# Patient Record
Sex: Male | Born: 1975 | Race: Black or African American | Hispanic: No | Marital: Single | Smoking: Current some day smoker
Health system: Southern US, Community
[De-identification: ages and names within clinical notes are randomized; demographics above are authoritative.]

## PROBLEM LIST (undated history)

## (undated) DIAGNOSIS — G5603 Carpal tunnel syndrome, bilateral upper limbs: Secondary | ICD-10-CM

## (undated) DIAGNOSIS — E119 Type 2 diabetes mellitus without complications: Secondary | ICD-10-CM

## (undated) HISTORY — PX: HERNIA REPAIR: SHX51

## (undated) HISTORY — DX: Type 2 diabetes mellitus without complications: E11.9

## (undated) HISTORY — PX: SPINAL CORD STIMULATOR TRIAL: SHX5380

## (undated) HISTORY — DX: Carpal tunnel syndrome, bilateral upper limbs: G56.03

## (undated) HISTORY — PX: EYE SURGERY: SHX253

---

## 2002-03-19 ENCOUNTER — Emergency Department (HOSPITAL_COMMUNITY): Admission: EM | Admit: 2002-03-19 | Discharge: 2002-03-19 | Payer: Self-pay | Admitting: Emergency Medicine

## 2004-05-15 ENCOUNTER — Emergency Department (HOSPITAL_COMMUNITY): Admission: EM | Admit: 2004-05-15 | Discharge: 2004-05-15 | Payer: Self-pay | Admitting: *Deleted

## 2006-04-06 ENCOUNTER — Emergency Department (HOSPITAL_COMMUNITY): Admission: EM | Admit: 2006-04-06 | Discharge: 2006-04-06 | Payer: Self-pay | Admitting: Emergency Medicine

## 2008-03-30 ENCOUNTER — Emergency Department (HOSPITAL_COMMUNITY): Admission: EM | Admit: 2008-03-30 | Discharge: 2008-03-30 | Payer: Self-pay | Admitting: Emergency Medicine

## 2008-05-11 ENCOUNTER — Emergency Department (HOSPITAL_COMMUNITY): Admission: EM | Admit: 2008-05-11 | Discharge: 2008-05-11 | Payer: Self-pay | Admitting: Emergency Medicine

## 2011-06-04 LAB — DIFFERENTIAL
Basophils Relative: 1
Eosinophils Absolute: 0.1
Eosinophils Relative: 2
Monocytes Absolute: 0.4

## 2011-06-04 LAB — CBC
HCT: 43.8
MCHC: 34
Platelets: 250
RBC: 4.61
WBC: 5.4

## 2011-06-04 LAB — ANA: Anti Nuclear Antibody(ANA): NEGATIVE

## 2011-06-04 LAB — POCT I-STAT, CHEM 8
Calcium, Ion: 1.11 — ABNORMAL LOW
Chloride: 105
HCT: 45
Hemoglobin: 15.3
Sodium: 140
TCO2: 27

## 2011-06-04 LAB — RHEUMATOID FACTOR: Rhuematoid fact SerPl-aCnc: <20

## 2014-03-25 ENCOUNTER — Emergency Department (HOSPITAL_BASED_OUTPATIENT_CLINIC_OR_DEPARTMENT_OTHER)
Admission: EM | Admit: 2014-03-25 | Discharge: 2014-03-26 | Disposition: A | Discharge status: Home / Self Care | Payer: Worker's Compensation | Attending: Emergency Medicine | Admitting: Emergency Medicine

## 2014-03-25 ENCOUNTER — Encounter (HOSPITAL_BASED_OUTPATIENT_CLINIC_OR_DEPARTMENT_OTHER): Payer: Self-pay | Admitting: Emergency Medicine

## 2014-03-25 ENCOUNTER — Emergency Department (HOSPITAL_BASED_OUTPATIENT_CLINIC_OR_DEPARTMENT_OTHER): Payer: Worker's Compensation

## 2014-03-25 DIAGNOSIS — S0280XA Fracture of other specified skull and facial bones, unspecified side, initial encounter for closed fracture: Secondary | ICD-10-CM | POA: Insufficient documentation

## 2014-03-25 DIAGNOSIS — S0993XA Unspecified injury of face, initial encounter: Secondary | ICD-10-CM | POA: Insufficient documentation

## 2014-03-25 DIAGNOSIS — S065X0A Traumatic subdural hemorrhage without loss of consciousness, initial encounter: Secondary | ICD-10-CM | POA: Insufficient documentation

## 2014-03-25 DIAGNOSIS — H1133 Conjunctival hemorrhage, bilateral: Secondary | ICD-10-CM

## 2014-03-25 DIAGNOSIS — S0291XA Unspecified fracture of skull, initial encounter for closed fracture: Secondary | ICD-10-CM

## 2014-03-25 DIAGNOSIS — H1130 Conjunctival hemorrhage, unspecified eye: Secondary | ICD-10-CM | POA: Diagnosis not present

## 2014-03-25 DIAGNOSIS — S199XXA Unspecified injury of neck, initial encounter: Secondary | ICD-10-CM
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
## 2014-03-26 MED ORDER — HYDROCODONE-ACETAMINOPHEN 5-325 MG PO TABS: 1.0000 | ORAL_TABLET | Freq: Four times a day (QID) | ORAL | Status: DC | PRN

## 2014-03-26 MED ORDER — HYDROCODONE-ACETAMINOPHEN 5-325 MG PO TABS
1.0000 | ORAL_TABLET | Freq: Once | ORAL | Status: AC
  Administered 2014-03-26: 1 via ORAL
  Filled 2014-03-26: qty 1

## 2014-03-26 MED ORDER — DIPHENHYDRAMINE HCL 50 MG/ML IJ SOLN: 50.0000 mg | Freq: Once | INTRAMUSCULAR | Status: DC

## 2014-03-26 MED ORDER — METHYLPREDNISOLONE SODIUM SUCC 125 MG IJ SOLR: 125.0000 mg | Freq: Once | INTRAMUSCULAR | Status: DC

## 2014-03-26 MED ORDER — FAMOTIDINE IN NACL 20-0.9 MG/50ML-% IV SOLN: 20.0000 mg | Freq: Once | INTRAVENOUS | Status: DC

## 2019-06-22 ENCOUNTER — Other Ambulatory Visit: Payer: Self-pay

## 2019-06-22 DIAGNOSIS — Z20822 Contact with and (suspected) exposure to covid-19: Secondary | ICD-10-CM

## 2019-06-24 LAB — NOVEL CORONAVIRUS, NAA: SARS-CoV-2, NAA: NOT DETECTED

## 2019-07-08 ENCOUNTER — Other Ambulatory Visit: Payer: Self-pay

## 2019-07-08 DIAGNOSIS — Z20822 Contact with and (suspected) exposure to covid-19: Secondary | ICD-10-CM

## 2019-07-09 LAB — NOVEL CORONAVIRUS, NAA

## 2019-08-02 ENCOUNTER — Other Ambulatory Visit: Payer: Self-pay

## 2019-08-02 DIAGNOSIS — Z20822 Contact with and (suspected) exposure to covid-19: Secondary | ICD-10-CM

## 2019-08-03 LAB — NOVEL CORONAVIRUS, NAA: SARS-CoV-2, NAA: NOT DETECTED

## 2020-12-20 ENCOUNTER — Other Ambulatory Visit: Payer: Self-pay | Admitting: Family Medicine

## 2020-12-20 ENCOUNTER — Ambulatory Visit: Payer: Self-pay

## 2020-12-20 ENCOUNTER — Other Ambulatory Visit: Payer: Self-pay

## 2020-12-20 DIAGNOSIS — R102 Pelvic and perineal pain: Secondary | ICD-10-CM

## 2021-03-21 ENCOUNTER — Other Ambulatory Visit: Payer: Self-pay | Admitting: Orthopedic Surgery

## 2021-03-21 DIAGNOSIS — G8929 Other chronic pain: Secondary | ICD-10-CM

## 2021-04-09 ENCOUNTER — Other Ambulatory Visit: Payer: Self-pay

## 2021-04-09 ENCOUNTER — Ambulatory Visit
Admission: RE | Admit: 2021-04-09 | Discharge: 2021-04-09 | Disposition: A | Discharge status: Home / Self Care | Payer: No Typology Code available for payment source | Source: Ambulatory Visit | Attending: Orthopedic Surgery | Admitting: Orthopedic Surgery

## 2021-04-09 DIAGNOSIS — M545 Low back pain, unspecified: Secondary | ICD-10-CM

## 2021-04-09 DIAGNOSIS — G8929 Other chronic pain: Secondary | ICD-10-CM

## 2021-04-09 MED ORDER — METHYLPREDNISOLONE ACETATE 40 MG/ML INJ SUSP (RADIOLOG
80.0000 mg | Freq: Once | INTRAMUSCULAR | Status: AC
  Administered 2021-04-09: 80 mg via INTRA_ARTICULAR

## 2021-04-09 MED ORDER — METHYLPREDNISOLONE ACETATE 80 MG/ML IJ SUSP: 80.0000 mg | Freq: Once | INTRAMUSCULAR | Status: AC

## 2021-12-15 ENCOUNTER — Encounter (HOSPITAL_COMMUNITY): Payer: Self-pay | Admitting: Emergency Medicine

## 2021-12-15 ENCOUNTER — Observation Stay (HOSPITAL_COMMUNITY)
Admission: EM | Admit: 2021-12-15 | Discharge: 2021-12-17 | Disposition: A | Discharge status: Home / Self Care | Payer: Self-pay | Attending: Internal Medicine | Admitting: Internal Medicine

## 2021-12-15 ENCOUNTER — Other Ambulatory Visit: Payer: Self-pay

## 2021-12-15 DIAGNOSIS — E871 Hypo-osmolality and hyponatremia: Secondary | ICD-10-CM | POA: Insufficient documentation

## 2021-12-15 DIAGNOSIS — R739 Hyperglycemia, unspecified: Secondary | ICD-10-CM

## 2021-12-15 DIAGNOSIS — E1165 Type 2 diabetes mellitus with hyperglycemia: Principal | ICD-10-CM | POA: Insufficient documentation

## 2021-12-15 DIAGNOSIS — Z79899 Other long term (current) drug therapy: Secondary | ICD-10-CM | POA: Insufficient documentation

## 2021-12-15 DIAGNOSIS — E869 Volume depletion, unspecified: Secondary | ICD-10-CM | POA: Insufficient documentation

## 2021-12-15 DIAGNOSIS — R718 Other abnormality of red blood cells: Secondary | ICD-10-CM

## 2021-12-15 DIAGNOSIS — R748 Abnormal levels of other serum enzymes: Secondary | ICD-10-CM | POA: Insufficient documentation

## 2021-12-15 DIAGNOSIS — E1100 Type 2 diabetes mellitus with hyperosmolarity without nonketotic hyperglycemic-hyperosmolar coma (NKHHC): Secondary | ICD-10-CM | POA: Diagnosis present

## 2021-12-15 DIAGNOSIS — R7989 Other specified abnormal findings of blood chemistry: Secondary | ICD-10-CM

## 2021-12-15 DIAGNOSIS — E1110 Type 2 diabetes mellitus with ketoacidosis without coma: Secondary | ICD-10-CM | POA: Diagnosis present

## 2021-12-15 DIAGNOSIS — N179 Acute kidney failure, unspecified: Secondary | ICD-10-CM | POA: Insufficient documentation

## 2021-12-15 DIAGNOSIS — D582 Other hemoglobinopathies: Secondary | ICD-10-CM

## 2021-12-15 DIAGNOSIS — E860 Dehydration: Secondary | ICD-10-CM | POA: Insufficient documentation

## 2021-12-15 LAB — COMPREHENSIVE METABOLIC PANEL
ALT: 29 U/L (ref 0–44)
AST: 21 U/L (ref 15–41)
Albumin: 4.1 g/dL (ref 3.5–5.0)
Alkaline Phosphatase: 106 U/L (ref 38–126)
Anion gap: 18 — ABNORMAL HIGH (ref 5–15)
BUN: 11 mg/dL (ref 6–20)
CO2: 25 mmol/L (ref 22–32)
Calcium: 9.8 mg/dL (ref 8.9–10.3)
Chloride: 89 mmol/L — ABNORMAL LOW (ref 98–111)
Creatinine, Ser: 1.45 mg/dL — ABNORMAL HIGH (ref 0.61–1.24)
GFR, Estimated: 60 mL/min — ABNORMAL LOW (ref >60)
Glucose, Bld: 522 mg/dL (ref 70–99)
Potassium: 3.9 mmol/L (ref 3.5–5.1)
Sodium: 132 mmol/L — ABNORMAL LOW (ref 135–145)
Total Bilirubin: 1.3 mg/dL — ABNORMAL HIGH (ref 0.3–1.2)
Total Protein: 7.9 g/dL (ref 6.5–8.1)

## 2021-12-15 LAB — CBC
HCT: 53.6 % — ABNORMAL HIGH (ref 39.0–52.0)
Hemoglobin: 18.8 g/dL — ABNORMAL HIGH (ref 13.0–17.0)
MCH: 31.3 pg (ref 26.0–34.0)
MCHC: 35.1 g/dL (ref 30.0–36.0)
MCV: 89.2 fL (ref 80.0–100.0)
Platelets: 257 10*3/uL (ref 150–400)
RBC: 6.01 MIL/uL — ABNORMAL HIGH (ref 4.22–5.81)
RDW: 12.2 % (ref 11.5–15.5)
WBC: 7.7 10*3/uL (ref 4.0–10.5)
nRBC: 0 % (ref 0.0–0.2)

## 2021-12-15 LAB — LIPASE, BLOOD: Lipase: 62 U/L — ABNORMAL HIGH (ref 11–51)

## 2021-12-15 NOTE — ED Triage Notes (Addendum)
Pt presents to ED triage with several complaints including dry mouth, smell aversions,inability to tolerate food other protein shakes and fruit, inability to swallow food due to constant need to chew- relating back to dry mouth, excessive water consumption, frequent urination and constipation. Pt states that everything smells like car exhaust. Was able to relieve constipation after eating kiwi fruit. Pt state "I just want to eat".  Reports symptoms have been ongoing for two weeks, however he has lost 15-20lbs in past three weeks.  ?

## 2021-12-16 DIAGNOSIS — N179 Acute kidney failure, unspecified: Secondary | ICD-10-CM

## 2021-12-16 DIAGNOSIS — D582 Other hemoglobinopathies: Secondary | ICD-10-CM

## 2021-12-16 DIAGNOSIS — E1100 Type 2 diabetes mellitus with hyperosmolarity without nonketotic hyperglycemic-hyperosmolar coma (NKHHC): Secondary | ICD-10-CM | POA: Diagnosis present

## 2021-12-16 DIAGNOSIS — Z794 Long term (current) use of insulin: Secondary | ICD-10-CM

## 2021-12-16 DIAGNOSIS — E1165 Type 2 diabetes mellitus with hyperglycemia: Secondary | ICD-10-CM

## 2021-12-16 DIAGNOSIS — R7989 Other specified abnormal findings of blood chemistry: Secondary | ICD-10-CM

## 2021-12-16 DIAGNOSIS — R748 Abnormal levels of other serum enzymes: Secondary | ICD-10-CM

## 2021-12-16 DIAGNOSIS — R718 Other abnormality of red blood cells: Secondary | ICD-10-CM

## 2021-12-16 DIAGNOSIS — E1110 Type 2 diabetes mellitus with ketoacidosis without coma: Secondary | ICD-10-CM | POA: Diagnosis present

## 2021-12-16 DIAGNOSIS — E860 Dehydration: Secondary | ICD-10-CM

## 2021-12-16 LAB — BASIC METABOLIC PANEL
Anion gap: 16 — ABNORMAL HIGH (ref 5–15)
Anion gap: 9 (ref 5–15)
Anion gap: 8 (ref 5–15)
Anion gap: 9 (ref 5–15)
BUN: 11 mg/dL (ref 6–20)
BUN: 9 mg/dL (ref 6–20)
BUN: 8 mg/dL (ref 6–20)
BUN: 8 mg/dL (ref 6–20)
CO2: 26 mmol/L (ref 22–32)
CO2: 25 mmol/L (ref 22–32)
CO2: 27 mmol/L (ref 22–32)
CO2: 26 mmol/L (ref 22–32)
Calcium: 9.9 mg/dL (ref 8.9–10.3)
Calcium: 8.9 mg/dL (ref 8.9–10.3)
Calcium: 8.8 mg/dL — ABNORMAL LOW (ref 8.9–10.3)
Calcium: 8.5 mg/dL — ABNORMAL LOW (ref 8.9–10.3)
Chloride: 92 mmol/L — ABNORMAL LOW (ref 98–111)
Chloride: 103 mmol/L (ref 98–111)
Chloride: 103 mmol/L (ref 98–111)
Chloride: 105 mmol/L (ref 98–111)
Creatinine, Ser: 1.39 mg/dL — ABNORMAL HIGH (ref 0.61–1.24)
Creatinine, Ser: 0.91 mg/dL (ref 0.61–1.24)
Creatinine, Ser: 0.87 mg/dL (ref 0.61–1.24)
Creatinine, Ser: 0.96 mg/dL (ref 0.61–1.24)
GFR, Estimated: >60 mL/min (ref >60)
GFR, Estimated: >60 mL/min (ref >60)
GFR, Estimated: >60 mL/min (ref >60)
GFR, Estimated: >60 mL/min (ref >60)
Glucose, Bld: 562 mg/dL (ref 70–99)
Glucose, Bld: 285 mg/dL — ABNORMAL HIGH (ref 70–99)
Glucose, Bld: 321 mg/dL — ABNORMAL HIGH (ref 70–99)
Glucose, Bld: 224 mg/dL — ABNORMAL HIGH (ref 70–99)
Potassium: 4.2 mmol/L (ref 3.5–5.1)
Potassium: 3.4 mmol/L — ABNORMAL LOW (ref 3.5–5.1)
Potassium: 3.1 mmol/L — ABNORMAL LOW (ref 3.5–5.1)
Potassium: 3.5 mmol/L (ref 3.5–5.1)
Sodium: 134 mmol/L — ABNORMAL LOW (ref 135–145)
Sodium: 137 mmol/L (ref 135–145)
Sodium: 138 mmol/L (ref 135–145)
Sodium: 140 mmol/L (ref 135–145)

## 2021-12-16 LAB — CBG MONITORING, ED
Glucose-Capillary: 122 mg/dL — ABNORMAL HIGH (ref 70–99)
Glucose-Capillary: 146 mg/dL — ABNORMAL HIGH (ref 70–99)
Glucose-Capillary: 209 mg/dL — ABNORMAL HIGH (ref 70–99)
Glucose-Capillary: 215 mg/dL — ABNORMAL HIGH (ref 70–99)
Glucose-Capillary: 227 mg/dL — ABNORMAL HIGH (ref 70–99)
Glucose-Capillary: 258 mg/dL — ABNORMAL HIGH (ref 70–99)
Glucose-Capillary: 268 mg/dL — ABNORMAL HIGH (ref 70–99)
Glucose-Capillary: 277 mg/dL — ABNORMAL HIGH (ref 70–99)
Glucose-Capillary: 284 mg/dL — ABNORMAL HIGH (ref 70–99)
Glucose-Capillary: 397 mg/dL — ABNORMAL HIGH (ref 70–99)
Glucose-Capillary: 533 mg/dL (ref 70–99)
Glucose-Capillary: >600 mg/dL (ref 70–99)

## 2021-12-16 LAB — OSMOLALITY: Osmolality: 323 mOsm/kg (ref 275–295)

## 2021-12-16 LAB — I-STAT VENOUS BLOOD GAS, ED
Acid-Base Excess: 2 mmol/L (ref 0.0–2.0)
Bicarbonate: 28.4 mmol/L — ABNORMAL HIGH (ref 20.0–28.0)
Calcium, Ion: 1.14 mmol/L — ABNORMAL LOW (ref 1.15–1.40)
HCT: 53 % — ABNORMAL HIGH (ref 39.0–52.0)
Hemoglobin: 18 g/dL — ABNORMAL HIGH (ref 13.0–17.0)
O2 Saturation: 39 %
Potassium: 4.2 mmol/L (ref 3.5–5.1)
Sodium: 131 mmol/L — ABNORMAL LOW (ref 135–145)
TCO2: 30 mmol/L (ref 22–32)
pCO2, Ven: 50.2 mmHg (ref 44–60)
pH, Ven: 7.361 (ref 7.25–7.43)
pO2, Ven: 24 mmHg — CL (ref 32–45)

## 2021-12-16 LAB — URINALYSIS, ROUTINE W REFLEX MICROSCOPIC
Bacteria, UA: NONE SEEN
Bilirubin Urine: NEGATIVE
Glucose, UA: >=500 mg/dL — AB
Hgb urine dipstick: NEGATIVE
Ketones, ur: 80 mg/dL — AB
Leukocytes,Ua: NEGATIVE
Nitrite: NEGATIVE
Protein, ur: NEGATIVE mg/dL
Specific Gravity, Urine: 1.031 — ABNORMAL HIGH (ref 1.005–1.030)
pH: 5 (ref 5.0–8.0)

## 2021-12-16 LAB — BETA-HYDROXYBUTYRIC ACID: Beta-Hydroxybutyric Acid: 5.08 mmol/L — ABNORMAL HIGH (ref 0.05–0.27)

## 2021-12-16 LAB — GLUCOSE, CAPILLARY
Glucose-Capillary: 413 mg/dL — ABNORMAL HIGH (ref 70–99)
Glucose-Capillary: 420 mg/dL — ABNORMAL HIGH (ref 70–99)

## 2021-12-16 LAB — PHOSPHORUS: Phosphorus: 3.1 mg/dL (ref 2.5–4.6)

## 2021-12-16 LAB — MAGNESIUM: Magnesium: 2 mg/dL (ref 1.7–2.4)

## 2021-12-16 MED ORDER — DEXTROSE 50 % IV SOLN: 0.0000 mL | INTRAVENOUS | Status: DC | PRN

## 2021-12-16 MED ORDER — LACTATED RINGERS IV SOLN: INTRAVENOUS | Status: DC

## 2021-12-16 MED ORDER — POTASSIUM CHLORIDE CRYS ER 20 MEQ PO TBCR
40.0000 meq | EXTENDED_RELEASE_TABLET | Freq: Once | ORAL | Status: AC
  Administered 2021-12-16: 40 meq via ORAL
  Filled 2021-12-16: qty 2

## 2021-12-16 MED ORDER — INSULIN REGULAR(HUMAN) IN NACL 100-0.9 UT/100ML-% IV SOLN
INTRAVENOUS | Status: DC
  Administered 2021-12-17: 7.5 [IU]/h via INTRAVENOUS
  Filled 2021-12-16: qty 100

## 2021-12-16 MED ORDER — SODIUM CHLORIDE 0.9 % IV BOLUS
1000.0000 mL | Freq: Once | INTRAVENOUS | Status: AC
  Administered 2021-12-16: 1000 mL via INTRAVENOUS

## 2021-12-16 MED ORDER — ONDANSETRON HCL 4 MG/2ML IJ SOLN: 4.0000 mg | Freq: Four times a day (QID) | INTRAMUSCULAR | Status: DC | PRN

## 2021-12-16 MED ORDER — POTASSIUM CHLORIDE 10 MEQ/100ML IV SOLN
10.0000 meq | INTRAVENOUS | Status: AC
  Administered 2021-12-16 (×2): 10 meq via INTRAVENOUS
  Filled 2021-12-16 (×2): qty 100

## 2021-12-16 MED ORDER — DEXTROSE IN LACTATED RINGERS 5 % IV SOLN: INTRAVENOUS | Status: DC

## 2021-12-16 MED ORDER — INSULIN GLARGINE-YFGN 100 UNIT/ML ~~LOC~~ SOLN
10.0000 [IU] | Freq: Every day | SUBCUTANEOUS | Status: DC
  Administered 2021-12-16: 10 [IU] via SUBCUTANEOUS
  Filled 2021-12-16 (×2): qty 0.1

## 2021-12-16 MED ORDER — PNEUMOCOCCAL 20-VAL CONJ VACC 0.5 ML IM SUSY
0.5000 mL | PREFILLED_SYRINGE | INTRAMUSCULAR | Status: DC
  Filled 2021-12-16: qty 0.5

## 2021-12-16 MED ORDER — POTASSIUM CHLORIDE 10 MEQ/100ML IV SOLN
10.0000 meq | INTRAVENOUS | Status: AC
  Administered 2021-12-17 (×2): 10 meq via INTRAVENOUS
  Filled 2021-12-16 (×2): qty 100

## 2021-12-16 MED ORDER — ONDANSETRON HCL 4 MG PO TABS: 4.0000 mg | ORAL_TABLET | Freq: Four times a day (QID) | ORAL | Status: DC | PRN

## 2021-12-16 MED ORDER — HEPARIN SODIUM (PORCINE) 5000 UNIT/ML IJ SOLN
5000.0000 [IU] | Freq: Three times a day (TID) | INTRAMUSCULAR | Status: DC
  Administered 2021-12-16 – 2021-12-17 (×3): 5000 [IU] via SUBCUTANEOUS
  Filled 2021-12-16 (×3): qty 1

## 2021-12-16 MED ORDER — ENSURE ENLIVE PO LIQD
237.0000 mL | Freq: Two times a day (BID) | ORAL | Status: DC
  Administered 2021-12-17: 237 mL via ORAL

## 2021-12-16 MED ORDER — LACTATED RINGERS IV BOLUS: Freq: Once | INTRAVENOUS | Status: AC

## 2021-12-16 MED ORDER — POTASSIUM CHLORIDE CRYS ER 20 MEQ PO TBCR
40.0000 meq | EXTENDED_RELEASE_TABLET | Freq: Once | ORAL | Status: AC
Start: 2021-12-16 — End: 2021-12-16
  Administered 2021-12-16: 40 meq via ORAL
  Filled 2021-12-16: qty 2

## 2021-12-16 MED ORDER — INSULIN ASPART 100 UNIT/ML IJ SOLN
7.0000 [IU] | Freq: Once | INTRAMUSCULAR | Status: AC
  Administered 2021-12-16: 7 [IU] via SUBCUTANEOUS

## 2021-12-16 MED ORDER — INSULIN REGULAR(HUMAN) IN NACL 100-0.9 UT/100ML-% IV SOLN
INTRAVENOUS | Status: DC
  Administered 2021-12-16: 6 [IU]/h via INTRAVENOUS
  Filled 2021-12-16: qty 100

## 2021-12-16 MED ORDER — INSULIN ASPART 100 UNIT/ML IJ SOLN
0.0000 [IU] | Freq: Three times a day (TID) | INTRAMUSCULAR | Status: DC
  Administered 2021-12-16: 5 [IU] via SUBCUTANEOUS

## 2021-12-16 MED ORDER — INSULIN ASPART 100 UNIT/ML IJ SOLN: 0.0000 [IU] | Freq: Every day | INTRAMUSCULAR | Status: DC

## 2021-12-16 NOTE — ED Notes (Signed)
Diabetic coordinator at bedside.  

## 2021-12-16 NOTE — Progress Notes (Signed)
Inpatient Diabetes Program Recommendations ? ?AACE/ADA: New Consensus Statement on Inpatient Glycemic Control (2015) ? ?Target Ranges:  Prepandial:   less than 140 mg/dL ?     Peak postprandial:   less than 180 mg/dL (1-2 hours) ?     Critically ill patients:  140 - 180 mg/dL  ? ?Lab Results  ?Component Value Date  ? GLUCAP 268 (H) 12/16/2021  ? ? ?Review of Glycemic Control ? ?Diabetes history: New-onset DM ?Outpatient Diabetes medications: None ?Current orders for Inpatient glycemic control: Semglee 10 QD, Novolog 0-9 units TID with meals and 0-5 HS ? ?HgbA1C - pending ?Transitioned off IV insulin earlier today. ? ?Inpatient Diabetes Program Recommendations:   ? ?Spoke with pt at bedside in ED regarding new diagnosis of diabetes. Discussed how diet, exercise, medications, stress management all play a role in glucose control. Pt has family hx of DM. Hx polyuria, polydipsia and has lost 15-20 pounds in the past month. Pt does not have insurance and will need affordable medication at discharge. Will need PCP to manage his diabetes. Will order Living Well book when pt transferred to floor. ? ?Semglee 20 QD ?Novolog 0-15 units TID with meals and 0-5 HS ? ?Will likely need meal coverage, if post-prandials > 180 mg/dL. Will teach insulin pen administration and make certain pt has survival skills for new diagnosis of DM.  ? ?Thank you. ?Ailene Ards, RD, LDN, CDE ?Inpatient Diabetes Coordinator ?[phone]  ? ? ? ? ? ? ?

## 2021-12-16 NOTE — Progress Notes (Signed)
Mr. Lussier admitted to [phone]. Alert and oriented x4. No complaints of pain. Oriented to room, call light, and bed controls. Assessed skin with Erin. Connected to telemetry. Bed placed in lowest position, call bell within reach.  ?

## 2021-12-16 NOTE — H&P (Signed)
?History and Physical  ? ? ?Patient: Brady Stephens IAX:[id number] DOB: July 01, 1976 ?DOA: 12/15/2021 ?DOS: the patient was seen and examined on 12/16/2021 ?PCP: Patient, No Pcp Per (Inactive)  ?Patient coming from: Home ? ?Chief Complaint:  ?Chief Complaint  ?Patient presents with  ? Food Intolerance  ? ?HPI: Brady Stephens is a 46 y.o. male with no significant medical history who presents to the emergency department due to 1 month onset of multiple complaints including dry mouth, excessive thirst, excessive urination, unintentional weight loss (15-20 pounds) and change in taste.  He denies fever, chills, nausea, vomiting, chest pain, shortness of breath.  He endorsed family history of diabetes mellitus, but denies ever being diagnosed with diabetes mellitus.  Patient does not follow-up with a PCP regularly. ? ?ED Course:  ?In the emergency department, he was hemodynamically stable.  Work-up in the ED showed elevated H/H (18.8/53.6), hyponatremia, hyperglycemia-522, anion gap 18, lipase 62. ?He was started on IV hydration and IV insulin drip per Endo tool.  Hospitalist was asked to admit patient for further evaluation and management. ? ?Review of Systems: ?Review of systems as noted in the HPI. All other systems reviewed and are negative. ? ? ?History reviewed. No pertinent past medical history. ?Past Surgical History:  ?Procedure Laterality Date  ? EYE SURGERY    ? 6 months old  ? HERNIA REPAIR    ? as child  ? ? ?Social History:  reports that he has never smoked. He does not have any smokeless tobacco history on file. He reports current alcohol use. He reports that he does not use drugs. ? ? ?No Known Allergies ? ?Family History: ?Positive family history of diabetes mellitus ? ? ?Prior to Admission medications   ?Medication Sig Start Date End Date Taking? Authorizing Provider  ?Flaxseed, Linseed, (FLAX SEEDS PO) Take 5 mLs by mouth daily.   Yes [provider]  ?Naphazoline-Pheniramine (VISINE-A OP) Place 1  drop into both eyes 2 (two) times daily as needed (dry itchy eyes).   Yes [provider]  ?OVER THE COUNTER MEDICATION Take 2 capsules by mouth daily. Sea Moss   Yes [provider]  ? ? ?Physical Exam: ?BP (!) 116/96 (BP Location: Right Arm)   Pulse 87   Temp 97.9 ?F (36.6 ?C)   Resp 17   Ht 5\' 8"  (1.727 m)   Wt 81.6 kg   SpO2 100%   BMI 27.37 kg/m?  ? ?General: 46 y.o. year-old male well developed well nourished in no acute distress.  Alert and oriented x3. ?HEENT: NCAT, EOMI dry mucous membrane ?Neck: Supple, trachea medial ?Cardiovascular: Regular rate and rhythm with no rubs or gallops.  No thyromegaly or JVD noted.  No lower extremity edema. 2/4 pulses in all 4 extremities. ?Respiratory: Clear to auscultation with no wheezes or rales. Good inspiratory effort. ?Abdomen: Soft, nontender nondistended with normal bowel sounds x4 quadrants. ?Muskuloskeletal: No cyanosis, clubbing or edema noted bilaterally ?Neuro: CN II-XII intact, strength 5/5 x 4, sensation, reflexes intact ?Skin: No ulcerative lesions noted or rashes ?Psychiatry: Judgement and insight appear normal. Mood is appropriate for condition and setting ?   ?   ?   ?Labs on Admission:  ?Basic Metabolic Panel: ?Recent Labs  ?Lab 12/15/21 ?2255 12/16/21 ?0107 12/16/21 ?0120  ?NA 132* 134* 131*  ?K 3.9 4.2 4.2  ?CL 89* 92*  --   ?CO2 25 26  --   ?GLUCOSE 522* 562*  --   ?BUN 11 11  --   ?  CREATININE 1.45* 1.39*  --   ?CALCIUM 9.8 9.9  --   ? ?Liver Function Tests: ?Recent Labs  ?Lab 12/15/21 ?2255  ?AST 21  ?ALT 29  ?ALKPHOS 106  ?BILITOT 1.3*  ?PROT 7.9  ?ALBUMIN 4.1  ? ?Recent Labs  ?Lab 12/15/21 ?2255  ?LIPASE 62*  ? ?No results for input(s): AMMONIA in the last 168 hours. ?CBC: ?Recent Labs  ?Lab 12/15/21 ?2255 12/16/21 ?0120  ?WBC 7.7  --   ?HGB 18.8* 18.0*  ?HCT 53.6* 53.0*  ?MCV 89.2  --   ?PLT 257  --   ? ?Cardiac Enzymes: ?No results for input(s): CKTOTAL, CKMB, CKMBINDEX, TROPONINI in the last 168 hours. ? ?BNP (last 3  results) ?No results for input(s): BNP in the last 8760 hours. ? ?ProBNP (last 3 results) ?No results for input(s): PROBNP in the last 8760 hours. ? ?CBG: ?Recent Labs  ?Lab 12/16/21 ?0004 12/16/21 ?0143  ?GLUCAP >600* 533*  ? ? ?Radiological Exams on Admission: ?No results found. ? ?EKG: I independently viewed the EKG done and my findings are as followed: EKG was not done in the ED ? ?Assessment/Plan ?Present on Admission: ? Hyperosmolar hyperglycemic state (HHS) (HCC) ? ?Principal Problem: ?  Hyperosmolar hyperglycemic state (HHS) (HCC) ?Active Problems: ?  Uncontrolled type 2 diabetes mellitus with hyperglycemia, with long-term current use of insulin (HCC) ?  Dehydration ?  Elevated hemoglobin (HCC) ?  Elevated hematocrit ?  Elevated lipase ?  Pseudohyponatremia ?  AKI (acute kidney injury) (HCC) ? ? ?Hyperglycemia secondary to poorly controlled type 2 diabetes mellitus ?High anion gap possibly secondary to multifactorial including dehydration, HHS/DKA ?Continue insulin drip, IV LR with IV potassium per DKA protocol ?Transition IV LR to D5 LR when serum glucose reaches 250mg /dL ?Continue serial BMP and VBG ?Hemoglobin A1c pending ?Continue to monitor for anion gap closure prior to transitioning patient to subcu insulin ?Continue NPO ? ?Dehydration ?Continue IV hydration ? ?Elevated hemoglobin/hematocrit ?H/H18.3/53.6, this is possibly due to dehydration ?Continue IV hydration and continue to monitor H/H with morning lab ? ?Pseudohyponatremia ?Na 132; corrected sodium level based on CBG ( 522) is 139 ? ?Elevated lipase ?Lipase 62, patient denies abdominal ?Continue to monitor lipase level ? ?Acute kidney injury ?BUN/creat 11/1.45 (no prior labs for comparison) ?Continue IV hydration ?Renally adjust medications, avoid nephrotoxic agents/dehydration/hypotension ? ? ?DVT prophylaxis: Heparin subcu ? ?Code Status: Full code ? ?Consults: None ? ?Family Communication: None at bedside ? ?Severity of Illness: ?The  appropriate patient status for this patient is OBSERVATION. Observation status is judged to be reasonable and necessary in order to provide the required intensity of service to ensure the patient's safety. The patient's presenting symptoms, physical exam findings, and initial radiographic and laboratory data in the context of their medical condition is felt to place them at decreased risk for further clinical deterioration. Furthermore, it is anticipated that the patient will be medically stable for discharge from the hospital within 2 midnights of admission.  ? ?Author: 12-14[birth year]1975, DO ?12/16/2021 2:05 AM ? ?For on call review [email or web address].  ?

## 2021-12-16 NOTE — ED Provider Notes (Signed)
? ?Emergency Department Provider Note ? ? ?I have reviewed the triage vital signs and the nursing notes. ? ? ?HISTORY ? ?Chief Complaint ?Food Intolerance ? ? ?HPI ?Brady Stephens is a 46 y.o. male with no known past medical history presents emergency department with multiple symptoms including dry mouth, unintentional weight loss, polyuria, polydipsia, and change in taste.  He does not see a PCP regularly.  He has a family history of diabetes but has never been told that he is diabetic or prediabetic in the past.  He is not experiencing fevers or chills.  No chest pain or shortness of breath.  No weakness or numbness.  ? ? ?History reviewed. No pertinent past medical history. ? ?Review of Systems ? ?Constitutional: No fever/chills. Positive fatigue and unintentional weight loss.  ?Eyes: No visual changes. ?ENT: No sore throat. Positive polydipsia and taste change.  ?Cardiovascular: Denies chest pain. ?Respiratory: Denies shortness of breath. ?Gastrointestinal: No abdominal pain.  Positive nausea, no vomiting.  No diarrhea.  Positive constipation. ?Genitourinary: Positive polyuria. ?Musculoskeletal: Negative for back pain. ?Skin: Negative for rash. ?Neurological: Negative for headaches, focal weakness or numbness. ? ?____________________________________________ ? ? ?PHYSICAL EXAM: ? ?VITAL SIGNS: ?ED Triage Vitals [12/15/21 1908]  ?Enc Vitals Group  ?   BP (!) 120/91  ?   Pulse Rate (!) 114  ?   Resp 18  ?   Temp 99 ?F (37.2 ?C)  ?   Temp Source Oral  ?   SpO2 100 %  ? ?Constitutional: Alert and oriented. Well appearing and in no acute distress. ?Eyes: Conjunctivae are normal.  ?Head: Atraumatic. ?Nose: No congestion/rhinnorhea. ?Mouth/Throat: Mucous membranes are dry.  ?Neck: No stridor.  ?Cardiovascular: Tachycardia. Good peripheral circulation. Grossly normal heart sounds.   ?Respiratory: Normal respiratory effort.  No retractions. Lungs CTAB. ?Gastrointestinal: Soft and nontender. No distention.   ?Musculoskeletal: No lower extremity tenderness nor edema. No gross deformities of extremities. ?Neurologic:  Normal speech and language. No gross focal neurologic deficits are appreciated.  ?Skin:  Skin is warm, dry and intact. No rash noted. ? ? ?____________________________________________ ?  ?LABS ?(all labs ordered are listed, but only abnormal results are displayed) ? ?Labs Reviewed  ?CBC - Abnormal; Notable for the following components:  ?    Result Value  ? RBC 6.01 (*)   ? Hemoglobin 18.8 (*)   ? HCT 53.6 (*)   ? All other components within normal limits  ?COMPREHENSIVE METABOLIC PANEL - Abnormal; Notable for the following components:  ? Sodium 132 (*)   ? Chloride 89 (*)   ? Glucose, Bld 522 (*)   ? Creatinine, Ser 1.45 (*)   ? Total Bilirubin 1.3 (*)   ? GFR, Estimated 60 (*)   ? Anion gap 18 (*)   ? All other components within normal limits  ?LIPASE, BLOOD - Abnormal; Notable for the following components:  ? Lipase 62 (*)   ? All other components within normal limits  ?I-STAT VENOUS BLOOD GAS, ED - Abnormal; Notable for the following components:  ? pO2, Ven 24 (*)   ? Bicarbonate 28.4 (*)   ? Sodium 131 (*)   ? Calcium, Ion 1.14 (*)   ? HCT 53.0 (*)   ? Hemoglobin 18.0 (*)   ? All other components within normal limits  ?CBG MONITORING, ED - Abnormal; Notable for the following components:  ? Glucose-Capillary >600 (*)   ? All other components within normal limits  ?HEMOGLOBIN A1C  ?BETA-HYDROXYBUTYRIC ACID  ?OSMOLALITY  ?  BASIC METABOLIC PANEL  ?BASIC METABOLIC PANEL  ?BASIC METABOLIC PANEL  ?BASIC METABOLIC PANEL  ?BASIC METABOLIC PANEL  ?URINALYSIS, ROUTINE W REFLEX MICROSCOPIC  ?I-STAT VENOUS BLOOD GAS, ED  ?CBG MONITORING, ED  ? ? ?____________________________________________ ? ? ?PROCEDURES ? ?Procedure(s) performed:  ? ?Procedures ? ?CRITICAL CARE ?Performed by: Maia Plan ?Total critical care time: 35 minutes ?Critical care time was exclusive of separately billable procedures and treating other  patients. ?Critical care was necessary to treat or prevent imminent or life-threatening deterioration. ?Critical care was time spent personally by me on the following activities: development of treatment plan with patient and/or surrogate as well as nursing, discussions with consultants, evaluation of patient's response to treatment, examination of patient, obtaining history from patient or surrogate, ordering and performing treatments and interventions, ordering and review of laboratory studies, ordering and review of radiographic studies, pulse oximetry and re-evaluation of patient's condition. ? ?Alona Bene, MD ?Emergency Medicine ? ?____________________________________________ ? ? ?INITIAL IMPRESSION / ASSESSMENT AND PLAN / ED COURSE ? ?Pertinent labs & imaging results that were available during my care of the patient were reviewed by me and considered in my medical decision making (see chart for details). ?  ?This patient is Presenting for Evaluation of polyuria/polydypsia, which does require a range of treatment options, and is a complaint that involves a high risk of morbidity and mortality. ? ?The Differential Diagnoses include hyperglycemia, HHS, DKA, electrolyte disturbance, sepsis.  ? ?Critical Interventions-  ?  ?Medications  ?lactated ringers bolus (has no administration in time range)  ?insulin regular, human (MYXREDLIN) 100 units/ 100 mL infusion (has no administration in time range)  ?lactated ringers infusion (has no administration in time range)  ?dextrose 5 % in lactated ringers infusion (has no administration in time range)  ?dextrose 50 % solution 0-50 mL (has no administration in time range)  ?potassium chloride 10 mEq in 100 mL IVPB (10 mEq Intravenous New Bag/Given 12/16/21 0127)  ?sodium chloride 0.9 % bolus 1,000 mL (1,000 mLs Intravenous New Bag/Given 12/16/21 0124)  ? ? ?Reassessment after intervention: HR improved with IVF.  ? ?I decided to review pertinent External Data, and in summary  last ED visit was 2015 for an unrelated complaint. ?  ?Clinical Laboratory Tests Ordered, included no leukocytosis or anemia.  Glucose on arrival greater than 600.  CMP shows glucose of 522 with normal CO2 but anion gap of 18.  ? ?Cardiac Monitor Tracing which shows sinus tachycardia. ? ?Medical Decision Making: Summary:  ?Patient presents emergency department with symptoms described above consistent with hyperglycemia.  Patient appears to potentially be in early DKA with anion gap.  CO2 is normal and have added a VBG along with beta hydroxybutyric acid.  He looks very well overall.  He has no primary care doctor.  This could be a new diagnosis of diabetes for him.  Plan for insulin infusion, IV fluids, electrolyte replacement and admit. ? ?Reevaluation with update and discussion with patient. Plan for admit. He is in agreement.  ? ?Consult: Hospitalist. Agrees with plan for admit.  ? ?Disposition: admit ? ?____________________________________________ ? ?FINAL CLINICAL IMPRESSION(S) / ED DIAGNOSES ? ?Final diagnoses:  ?Hyperglycemia  ? ? ?Note:  This document was prepared using Dragon voice recognition software and may include unintentional dictation errors. ? ?Alona Bene, MD, FACEP ?Emergency Medicine ? ?  ?Maia Plan, MD ?12/16/21 0142 ? ?

## 2021-12-16 NOTE — ED Notes (Signed)
MD notified of glucose, RN requesting orders or sliding scale or other insulin at this time  ?

## 2021-12-16 NOTE — ED Notes (Signed)
Per MD Pokhrel "added long acting insulin, give now, stop insulin drip in 2 hours, let me know in 2 hours for more orders" ? ?

## 2021-12-16 NOTE — Progress Notes (Addendum)
Same day note ? ?Patient seen and examined at bedside. ? ?Patient was admitted to the hospital for hyperosmolar hyperglycemic state. ? ?At the time of my evaluation, patient complains of feeling little better than yesterday.  Less thirst weakness. ? ?Physical examination reveals patient with mildly dry oral mucosa.  Alert awake oriented.  Communicative.  Chest clear. ? ?Laboratory data and imaging was reviewed ? ?Assessment and Plan. ? ?Hyperglycemia secondary to poorly controlled type 2 diabetes mellitus/new DKA. ?Patient presented with high anion gap metabolic acidosis likely secondary to diabetic ketoacidosis.  Patient was started on insulin drip and DKA protocol was followed.  Initial blood glucose level was more than 600.  Hemoglobin A1c pending.  Will likely need insulin on discharge.  We will get diabetic coordinator consult, nutrition consult.  Discussed about lifestyle modification in detail with the patient.  Patient will need PCP follow-up on discharge.  Will transition to long-acting and sliding scale insulin after stopping insulin drip. ? ?Volume depletion ?Continue IV hydration.  Check volume status in a.m. ? ?Hypokalemia.  We will replace orally and IV.  We will continue supplements orally.  Check levels in a.m. ? ?Elevated hemoglobin/hematocrit ?Likely secondary to volume depletion.  ?  ?Pseudohyponatremia ?Sodium has corrected at this time.  BMP in AM. ? ?Elevated lipase ?Nonspecific.  No abdominal pain. ? ?Acute kidney injury ?Mild.  Continue hydration.  Improved ? ?No Charge ? ?Signed, ? ?Delila Pereyra, MD ?[facility] ? ?

## 2021-12-17 ENCOUNTER — Other Ambulatory Visit (HOSPITAL_COMMUNITY): Payer: Self-pay

## 2021-12-17 DIAGNOSIS — E1110 Type 2 diabetes mellitus with ketoacidosis without coma: Secondary | ICD-10-CM

## 2021-12-17 LAB — BASIC METABOLIC PANEL
Anion gap: 9 (ref 5–15)
BUN: 7 mg/dL (ref 6–20)
CO2: 23 mmol/L (ref 22–32)
Calcium: 8.4 mg/dL — ABNORMAL LOW (ref 8.9–10.3)
Chloride: 101 mmol/L (ref 98–111)
Creatinine, Ser: 0.88 mg/dL (ref 0.61–1.24)
GFR, Estimated: >60 mL/min (ref >60)
Glucose, Bld: 293 mg/dL — ABNORMAL HIGH (ref 70–99)
Potassium: 3.5 mmol/L (ref 3.5–5.1)
Sodium: 133 mmol/L — ABNORMAL LOW (ref 135–145)

## 2021-12-17 LAB — GLUCOSE, CAPILLARY
Glucose-Capillary: 125 mg/dL — ABNORMAL HIGH (ref 70–99)
Glucose-Capillary: 131 mg/dL — ABNORMAL HIGH (ref 70–99)
Glucose-Capillary: 144 mg/dL — ABNORMAL HIGH (ref 70–99)
Glucose-Capillary: 152 mg/dL — ABNORMAL HIGH (ref 70–99)
Glucose-Capillary: 163 mg/dL — ABNORMAL HIGH (ref 70–99)
Glucose-Capillary: 168 mg/dL — ABNORMAL HIGH (ref 70–99)
Glucose-Capillary: 180 mg/dL — ABNORMAL HIGH (ref 70–99)
Glucose-Capillary: 218 mg/dL — ABNORMAL HIGH (ref 70–99)
Glucose-Capillary: 228 mg/dL — ABNORMAL HIGH (ref 70–99)
Glucose-Capillary: 311 mg/dL — ABNORMAL HIGH (ref 70–99)

## 2021-12-17 LAB — HEMOGLOBIN A1C
Hgb A1c MFr Bld: >15.5 % — ABNORMAL HIGH (ref 4.8–5.6)
Mean Plasma Glucose: >398 mg/dL

## 2021-12-17 LAB — CBC
HCT: 41.6 % (ref 39.0–52.0)
Hemoglobin: 14.4 g/dL (ref 13.0–17.0)
MCH: 31 pg (ref 26.0–34.0)
MCHC: 34.6 g/dL (ref 30.0–36.0)
MCV: 89.7 fL (ref 80.0–100.0)
Platelets: 166 10*3/uL (ref 150–400)
RBC: 4.64 MIL/uL (ref 4.22–5.81)
RDW: 12.4 % (ref 11.5–15.5)
WBC: 7.2 10*3/uL (ref 4.0–10.5)
nRBC: 0 % (ref 0.0–0.2)

## 2021-12-17 LAB — HIV ANTIBODY (ROUTINE TESTING W REFLEX): HIV Screen 4th Generation wRfx: NONREACTIVE

## 2021-12-17 LAB — MAGNESIUM: Magnesium: 1.7 mg/dL (ref 1.7–2.4)

## 2021-12-17 MED ORDER — INSULIN PEN NEEDLE 31G X 8 MM MISC
0 refills | Status: DC
  Filled 2021-12-17: qty 100, 25d supply, fill #0

## 2021-12-17 MED ORDER — INSULIN GLARGINE-YFGN 100 UNIT/ML ~~LOC~~ SOLN
20.0000 [IU] | Freq: Every day | SUBCUTANEOUS | Status: DC
  Administered 2021-12-17: 20 [IU] via SUBCUTANEOUS
  Filled 2021-12-17: qty 0.2

## 2021-12-17 MED ORDER — LIVING WELL WITH DIABETES BOOK
Freq: Once | Status: AC
  Filled 2021-12-17: qty 1

## 2021-12-17 MED ORDER — INSULIN ASPART 100 UNIT/ML IJ SOLN
0.0000 [IU] | Freq: Three times a day (TID) | INTRAMUSCULAR | Status: DC
  Administered 2021-12-17: 3 [IU] via SUBCUTANEOUS
  Administered 2021-12-17: 5 [IU] via SUBCUTANEOUS

## 2021-12-17 MED ORDER — INSULIN ASPART 100 UNIT/ML IJ SOLN
4.0000 [IU] | Freq: Three times a day (TID) | INTRAMUSCULAR | Status: DC
  Administered 2021-12-17 (×2): 4 [IU] via SUBCUTANEOUS

## 2021-12-17 MED ORDER — INSULIN PEN NEEDLE 32G X 8 MM MISC: 0 refills | Status: DC

## 2021-12-17 MED ORDER — NOVOLIN 70/30 FLEXPEN (70-30) 100 UNIT/ML ~~LOC~~ SUPN
14.0000 [IU] | PEN_INJECTOR | Freq: Two times a day (BID) | SUBCUTANEOUS | 11 refills | Status: DC
  Filled 2021-12-17: qty 9, 30d supply, fill #0
  Filled 2022-01-14: qty 9, 30d supply, fill #1
  Filled 2022-01-14: qty 3, 11d supply, fill #1

## 2021-12-17 MED ORDER — NOVOLIN 70/30 FLEXPEN RELION (70-30) 100 UNIT/ML ~~LOC~~ SUPN: 14.0000 [IU] | PEN_INJECTOR | Freq: Two times a day (BID) | SUBCUTANEOUS | 11 refills | Status: DC

## 2021-12-17 MED ORDER — INSULIN STARTER KIT- PEN NEEDLES (ENGLISH)
1.0000 | Freq: Once | Status: AC
  Administered 2021-12-17: 1
  Filled 2021-12-17: qty 1

## 2021-12-17 MED ORDER — INSULIN ASPART 100 UNIT/ML IJ SOLN: 0.0000 [IU] | Freq: Every day | INTRAMUSCULAR | Status: DC

## 2021-12-17 NOTE — Progress Notes (Signed)
?  Transition of Care (TOC) Screening Note ? ? ?Patient Details  ?Name: Brady Stephens ?Date of Birth: [birth year] ? ? ?Transition of Care (TOC) CM/SW Contact:    ?Mearl Latin, LCSW ?Phone Number: ?12/17/2021, 1:13 PM ? ? ? ?Transition of Care Department [facility]) has reviewed patient and no TOC needs have been identified at this time. We will continue to monitor patient advancement through interdisciplinary progression rounds. If new patient transition needs arise, please place a TOC consult. ? ? ?

## 2021-12-17 NOTE — TOC Initial Note (Signed)
Transition of Care (TOC) - Initial/Assessment Note  ? ? ?Patient Details  ?Name: Brady Stephens ?MRN: [id number] ?Date of Birth: 24-Oct-1975 ? ?Transition of Care [facility]) CM/SW Contact:    ?Ninfa Meeker, RN ?Phone Number: ?12/17/2021, 1:14 PM ? ?Clinical Narrative:    Case manager contacted [facility] to arrange PCP and hospital Followup. First available appointment is  Wednesday, May, 10th, they will work on getting patient in sooner. Patient will be called with appointment date and time. MATCH entered for patient to get meds.  ? ? ?Expected Discharge Plan: Home/Self Care ?Barriers to Discharge: No Barriers Identified ? ? ?Patient Goals and CMS Choice ?  ?  ?  ? ?Expected Discharge Plan and Services ?Expected Discharge Plan: Home/Self Care ?  ?Discharge Planning Services: [facility], [facility], Follow-up appt scheduled ?  ?  ?                ?DME Arranged: N/A ?DME Agency: NA ?  ?  ?  ?HH Arranged: NA ?[place] Agency: NA ?  ?  ?  ? ?Prior Living Arrangements/Services ?  ?  ?Patient language and need for interpreter reviewed:: Yes ?       ?  ?  ?  ?  ? ?Activities of Daily Living ?Home Assistive Devices/Equipment: None ?ADL Screening (condition at time of admission) ?Patient's cognitive ability adequate to safely complete daily activities?: Yes ?Is the patient deaf or have difficulty hearing?: No ?Does the patient have difficulty seeing, even when wearing glasses/contacts?: No ?Does the patient have difficulty concentrating, remembering, or making decisions?: No ?Patient able to express need for assistance with ADLs?: Yes ?Does the patient have difficulty dressing or bathing?: No ?Independently performs ADLs?: Yes (appropriate for developmental age) ?Does the patient have difficulty walking or climbing stairs?: No ?Weakness of Legs: None ?Weakness of Arms/Hands: None ? ?Permission Sought/Granted ?  ?  ?   ?   ?   ?   ? ?Emotional Assessment ?  ?  ?  ?  ?  ?  ? ?Admission diagnosis:  Hyperglycemia  [R73.9] ?Hyperosmolar hyperglycemic state (HHS) ([place]) [E11.00] ?Patient Active Problem List  ? Diagnosis Date Noted  ? Hyperosmolar hyperglycemic state (HHS) ([place]) 12/16/2021  ? Uncontrolled type 2 diabetes mellitus with hyperglycemia, with long-term current use of insulin ([place]) 12/16/2021  ? Dehydration 12/16/2021  ? Elevated hemoglobin ([place]) 12/16/2021  ? Elevated hematocrit 12/16/2021  ? Elevated lipase 12/16/2021  ? Pseudohyponatremia 12/16/2021  ? AKI (acute kidney injury) ([place]) 12/16/2021  ? ?PCP:  Patient, No Pcp Per (Inactive) ?Pharmacy:   ?[facility] [place] [address] ?[place] ?[place] [address] ?Phone: [phone] Fax: [phone] ? ? ? ? ?Social Determinants of Health (SDOH) Interventions ?  ? ?Readmission Risk Interventions ?   ? View : No data to display.  ?  ?  ?  ? ? ? ?

## 2021-12-17 NOTE — Progress Notes (Signed)
Inpatient Diabetes Program Recommendations ? ?AACE/ADA: New Consensus Statement on Inpatient Glycemic Control (2015) ? ?Target Ranges:  Prepandial:   less than 140 mg/dL ?     Peak postprandial:   less than 180 mg/dL (1-2 hours) ?     Critically ill patients:  140 - 180 mg/dL  ? ?Lab Results  ?Component Value Date  ? GLUCAP 152 (H) 12/17/2021  ? ? ?Review of Glycemic Control ? ?Current orders for Inpatient glycemic control: IV insulin, transitioning to SQ - Semglee 20 units QD, Novolog 0-15 units TID with meals and 0-5 HS ? ?Gave meter to pt and reviewed instructions for use. Recommended checking blood sugars at least 3x/day before meals and occasionally 2 hours after meals and bedtime.  ? ?Educated patient on insulin pen use at home. Reviewed contents of insulin flexpen starter kit. Reviewed all steps if insulin pen including attachment of needle, 2-unit air shot, dialing up dose, giving injection, removing needle, disposal of sharps, storage of unused insulin, disposal of insulin etc. Patient able to provide successful return demonstration. Also reviewed troubleshooting with insulin pen. MD to give patient Rxs for insulin pens and insulin pen needles.  ? ?Inpatient Diabetes Program Recommendations:   ? ?Pt will need affordable insulin as he doesn't have insurance.  ?Recommend Novolin 70/30 ReliOn BID dosing at discharge. Cost is $44 for 5 pens.  ?Will also need insulin pen needles (# [id number]) ?TOC consult for assistance with obtaining PCP. ? ?Will continue to follow.  ? ?Thank you. ?Lorenda Peck, RD, LDN, CDE ?Inpatient Diabetes Coordinator ?[phone]  ? ? ? ? ?

## 2021-12-17 NOTE — Progress Notes (Signed)
TRANSFERRED FROM RM 1 TO RM 37 VIA WHEELCHAIR WITH TELE AND HIS BELONGINGS OF A CELL PHONE , GLASSES BLACK FRAME , WALLET WITH MONEY UNDISCLOSED BY PATIENT AND DOESN'T WANT TO LOCK MONEY IN HOSPITAL SAFE , BOOK BAG BLACK AND PAIR TENNIS SHOES , CAP , SHIRT,  SWEAT PANTS. INSULIN DRIP IN PROGRESS  ?

## 2021-12-17 NOTE — Discharge Summary (Signed)
?Physician Discharge Summary ?  ?Patient: Brady Stephens MRN: [id number] DOB: 10-31-1975  ?Admit date:     12/15/2021  ?Discharge date: 12/18/21  ?Discharge Physician: Rebekah Chesterfield Roldan Laforest  ? ?PCP: Patient, No Pcp Per (Inactive)  ? ?Recommendations at discharge:  ? ?Follow-up with your primary care provider as has been scheduled.  Office to TO call if any early appointment.   ?Patient is a new diabetic and has been initiated on insulin on discharge.  We will need closer monitoring of his hemoglobin A1c blood glucose levels. ? ?Discharge Diagnoses: ?Active Problems: ?  Uncontrolled type 2 diabetes mellitus with hyperglycemia, with long-term current use of insulin (HCC) ? ?Principal Problem (Resolved): ?  DKA, type 2, not at goal Brady Stephens) ?Resolved Problems: ?  Dehydration ?  Elevated hemoglobin (HCC) ?  Elevated hematocrit ?  Elevated lipase ?  Pseudohyponatremia ?  AKI (acute kidney injury) (HCC) ? ?Hospital Course: ?Brady Stephens is a 46 y.o. male with no significant medical history presented to hospital with multiple complaints for a month including dry mouth excessive thirst and excessive urination with weight loss and change in his taste.  In the ED vitals were stable but hemoglobin was elevated at 18.8 and hyponatremia and hyperglycemia with blood glucose level of 522 and anion gap of 18 with lipase of 62.  Beta-hydroxybutyrate was elevated.  Patient was thought to have diabetic ketoacidosis and was admitted to hospital for IV hydration and insulin drip.    ? ?Assessment and Plan: ?  ?Hyperglycemia secondary to poorly controlled type 2 diabetes mellitus/new DKA. ?Patient was given insulin drip during hospitalization.  Has been seen by diabetic coordinator.  Has been initiated on insulin long-acting and sliding scale during hospitalization and will be changed to Novolin ReliOn 70/30, 14  units twice daily on discharge .  Hemoglobin A1c was more than 15.5.  TOC was consulted and patient is supposed to follow-up with PCP as  outpatient.  Currently patient does not have primary care provider.  Patient was counseled on the importance of remaining on diabetic diet and changing lifestyle modification. ? ?Volume depletion ?Improved after IV hydration. ?  ?Hypokalemia.  Proved after replacement.  Potassium prior to discharge was 3.5. ? ?Elevated hemoglobin/hematocrit ?Likely secondary to volume depletion.  Repeat hemoglobin was 14.4. ?  ?Pseudohyponatremia ?Sodium level prior to discharge was 133. ? ?Elevated lipase ?Nonspecific.  No abdominal pain. ? ?Acute kidney injury ?Mild.  Creatinine on presentation was 1.4.  Continue hydration.  Improved.  Creatinine prior to discharge was 0.8. ? ?Consultants: None ? ?Procedures performed: None ? ?Disposition: Home ? ?Diet recommendation:  ?Discharge Diet Orders (From admission, onward)  ? ?  Start     Ordered  ? 12/17/21 0000  Diet - low sodium heart healthy       ? 12/17/21 1331  ? 12/17/21 0000  Diet Carb Modified       ? 12/17/21 1331  ? ?  ?  ? ?  ? ?Carb modified diet ?DISCHARGE MEDICATION: ?Allergies as of 12/17/2021   ?No Known Allergies ?  ? ?  ?Medication List  ?  ? ?TAKE these medications   ? ?FLAX SEEDS PO ?Take 5 mLs by mouth daily. ?  ?NovoLIN 70/30 Kwikpen (70-30) 100 UNIT/ML KwikPen ?Generic drug: insulin isophane & regular human KwikPen ?Inject 14 Units into the skin 2 (two) times daily. ?  ?OVER THE COUNTER MEDICATION ?Take 2 capsules by mouth daily. [place] ?  ?Pentips 31G X 8 MM Misc ?Generic  drug: Insulin Pen Needle ?Use to inject insulin up to 4 times daily as directed. ?  ?VISINE-A OP ?Place 1 drop into both eyes 2 (two) times daily as needed (dry itchy eyes). ?  ? ?  ? ?Subjective: ?Today, patient was seen and examined at bedside.  Feels much better today.  No nausea vomiting fever chills.  Diabetic coordinator at bedside. ? ?Discharge Exam: ?Ceasar MonsFiled Weights  ? 12/16/21 0136  ?Weight: 81.6 kg  ? ? ?  12/17/2021  ? 12:13 PM 12/17/2021  ?  8:17 AM 12/17/2021  ?  7:45 AM  ?Vitals with  BMI  ?Systolic 104 97   ?Diastolic 74 69   ?Pulse 72 66 66  ?  ?General:  Average built, not in obvious distress ?HENT:   No scleral pallor or icterus noted. Oral mucosa is moist.  ?Chest:  Clear breath sounds.  Diminished breath sounds bilaterally. No crackles or wheezes.  ?CVS: S1 &S2 heard. No murmur.  Regular rate and rhythm. ?Abdomen: Soft, nontender, nondistended.  Bowel sounds are heard.   ?Extremities: No cyanosis, clubbing or edema.  Peripheral pulses are palpable. ?Psych: Alert, awake and oriented, normal mood ?CNS:  No cranial nerve deficits.  Power equal in all extremities.   ?Skin: Warm and dry.  No rashes noted. ? ? ?Condition at discharge: good ? ?The results of significant diagnostics from this hospitalization (including imaging, microbiology, ancillary and laboratory) are listed below for reference.  ? ?Imaging Studies: ?No results found. ? ?Microbiology: ?Results for orders placed or performed in visit on 08/02/19  ?Novel Coronavirus, NAA ([facility])     Status: None  ? Collection Time: 08/02/19 12:00 AM  ? Specimen: Nasopharyngeal(NP) swabs in vial transport medium  ? NASOPHARYNGE  TESTING  ?Result Value Ref Range Status  ? SARS-CoV-2, NAA Not Detected Not Detected Final  ?  Comment: Testing was performed using the cobas(R) SARS-CoV-2 test. ?This nucleic acid amplification test was developed and its performance ?characteristics determined by [facility]. Nucleic acid ?amplification tests include PCR and TMA. This test has not been FDA ?cleared or approved. This test has been authorized by FDA under an ?Emergency Use Authorization (EUA). This test is only authorized for ?the duration of time the declaration that circumstances exist ?justifying the authorization of the emergency use of in vitro ?diagnostic tests for detection of SARS-CoV-2 virus and/or diagnosis ?of COVID-19 infection under section 564(b)(1) of the Act, 21 U.S.C. ?360bbb-3(b) (1), unless the authorization is terminated or  revoked ?sooner. ?When diagnostic testing is negative, the possibility of a false ?negative result should be considered in the context of a patient's ?recent exposures and the presence of clinical signs and symptoms ?consistent with COVID-19. An individual without symptoms  of COVID-19 ?and who is not shedding SARS-CoV-2 virus would expect to have a ?negative (not detected) result in this assay. ?  ? ? ?Labs: ?CBC: ?Recent Labs  ?Lab 12/15/21 ?2255 12/16/21 ?0120 12/17/21 ?[id number]  ?WBC 7.7  --  7.2  ?HGB 18.8* 18.0* 14.4  ?HCT 53.6* 53.0* 41.6  ?MCV 89.2  --  89.7  ?PLT 257  --  166  ? ?Basic Metabolic Panel: ?Recent Labs  ?Lab 12/16/21 ?0107 12/16/21 ?0120 12/16/21 ?0445 12/16/21 ?[id number] 12/16/21 ?1300 12/17/21 ?[id number]  ?NA 134* 131* 137 138 140 133*  ?K 4.2 4.2 3.4* 3.1* 3.5 3.5  ?CL 92*  --  103 103 105 101  ?CO2 26  --  25 27 26 23   ?GLUCOSE 562*  --  285* 321* 224*  293*  ?BUN 11  --  9 8 8 7   ?CREATININE 1.39*  --  0.91 0.87 0.96 0.88  ?CALCIUM 9.9  --  8.9 8.8* 8.5* 8.4*  ?MG  --   --   --  2.0  --  1.7  ?PHOS  --   --   --  3.1  --   --   ? ?Liver Function Tests: ?Recent Labs  ?Lab 12/15/21 ?2255  ?AST 21  ?ALT 29  ?ALKPHOS 106  ?BILITOT 1.3*  ?PROT 7.9  ?ALBUMIN 4.1  ? ?CBG: ?Recent Labs  ?Lab 12/17/21 ?12/19/21 12/17/21 ?0701 12/17/21 ?12/19/21 12/17/21 ?12/19/21 12/17/21 ?1216  ?GLUCAP 168* 180* 144* 152* 218*  ? ? ?Discharge time spent: greater than 30 minutes. ? ?Signed: ?12/19/21, MD ?[facility] ?12/18/2021 ?

## 2021-12-17 NOTE — Progress Notes (Signed)
Nutrition Education Note ? ?RD consulted for nutrition education regarding diabetes.  ? ?Lab Results  ?Component Value Date  ? HGBA1C >15.5 (H) 12/16/2021  ? ? ?RD provided "Carbohydrate Counting for People with Diabetes" and "Plate Method" handouts from the Academy of Nutrition and Dietetics. Discussed different food groups and their effects on blood sugar, emphasizing carbohydrate-containing foods. Provided list of carbohydrates and recommended serving sizes of common foods. ? ?Discussed importance of controlled and consistent carbohydrate intake throughout the day. Provided examples of ways to balance meals/snacks and encouraged intake of high-fiber, whole grain complex carbohydrates. Teach back method used. ? ?Expect good compliance. ? ?Body mass index is 27.37 kg/m?Marland Kitchen Pt meets criteria for overweight based on current BMI. ? ?Current diet order is carbohydrate modified, patient is consuming approximately 100% of meals at this time. Labs and medications reviewed. Patient reports recent weight loss, related to new DM diagnosis. He has been eating well. No further nutrition interventions warranted at this time. RD contact information provided. If additional nutrition issues arise, please re-consult RD. ? ?Gabriel Rainwater RD, LDN, CNSC ?Please refer to Amion for contact information.                                                       ? ? ?

## 2021-12-27 ENCOUNTER — Inpatient Hospital Stay: Payer: Self-pay | Admitting: Nurse Practitioner

## 2022-01-14 ENCOUNTER — Other Ambulatory Visit (HOSPITAL_COMMUNITY): Payer: Self-pay

## 2022-01-14 ENCOUNTER — Other Ambulatory Visit: Payer: Self-pay

## 2022-01-14 MED ORDER — INSULIN NPH ISOPHANE & REGULAR (70-30) 100 UNIT/ML ~~LOC~~ SUSP
SUBCUTANEOUS | 16 refills | Status: DC
  Filled 2022-01-14: qty 10, 31d supply, fill #0

## 2022-01-15 ENCOUNTER — Other Ambulatory Visit: Payer: Self-pay

## 2022-01-24 ENCOUNTER — Ambulatory Visit: Payer: Medicaid Other | Attending: Nurse Practitioner | Admitting: Nurse Practitioner

## 2022-01-24 ENCOUNTER — Other Ambulatory Visit: Payer: Self-pay

## 2022-01-24 ENCOUNTER — Encounter: Payer: Self-pay | Admitting: Nurse Practitioner

## 2022-01-24 VITALS — BP 112/79 | HR 82 | Wt 197.6 lb

## 2022-01-24 DIAGNOSIS — E1165 Type 2 diabetes mellitus with hyperglycemia: Secondary | ICD-10-CM

## 2022-01-24 DIAGNOSIS — Z1159 Encounter for screening for other viral diseases: Secondary | ICD-10-CM

## 2022-01-24 DIAGNOSIS — E785 Hyperlipidemia, unspecified: Secondary | ICD-10-CM

## 2022-01-24 DIAGNOSIS — Z1211 Encounter for screening for malignant neoplasm of colon: Secondary | ICD-10-CM

## 2022-01-24 DIAGNOSIS — Z794 Long term (current) use of insulin: Secondary | ICD-10-CM

## 2022-01-24 MED ORDER — TRUE METRIX METER W/DEVICE KIT
PACK | 0 refills | Status: DC
  Filled 2022-01-24: qty 1, 30d supply, fill #0

## 2022-01-24 MED ORDER — HUMULIN 70/30 KWIKPEN (70-30) 100 UNIT/ML ~~LOC~~ SUPN
14.0000 [IU] | PEN_INJECTOR | Freq: Two times a day (BID) | SUBCUTANEOUS | 3 refills | Status: DC
  Filled 2022-01-24: qty 9, 32d supply, fill #0

## 2022-01-24 MED ORDER — TRUE METRIX BLOOD GLUCOSE TEST VI STRP
ORAL_STRIP | 12 refills | Status: DC
  Filled 2022-01-24: qty 100, 50d supply, fill #0
  Filled 2022-03-30: qty 100, 50d supply, fill #1
  Filled 2022-05-04 – 2022-05-30 (×2): qty 100, 50d supply, fill #2
  Filled 2022-07-30: qty 100, 50d supply, fill #3

## 2022-01-24 MED ORDER — TRUEPLUS LANCETS 28G MISC
3 refills | Status: DC
  Filled 2022-01-24: qty 100, 50d supply, fill #0
  Filled 2022-03-03 – 2022-03-30 (×3): qty 100, 50d supply, fill #1

## 2022-01-24 NOTE — Progress Notes (Addendum)
Assessment & Plan:  Brady Stephens was seen today for establish care.  Diagnoses and all orders for this visit:  Uncontrolled type 2 diabetes mellitus with hyperglycemia, with long-term current use of insulin (HCC) -     Microalbumin / creatinine urine ratio -     Blood Glucose Monitoring Suppl (TRUE METRIX METER) w/Device KIT; Use as instructed. Check blood glucose level by fingerstick two times per day. -     TRUEplus Lancets 28G MISC; Use as instructed. Check blood glucose level by fingerstick two times per day. -     glucose blood (TRUE METRIX BLOOD GLUCOSE TEST) test strip; Use as instructed. Check blood glucose level by fingerstick twice per day. -     insulin isophane & regular human KwikPen (HUMULIN 70/30 KWIKPEN) (70-30) 100 UNIT/ML KwikPen; Inject 14 Units into the skin 2 (two) times daily.  Colon cancer screening -     Fecal occult blood, imunochemical([facility])  Need for hepatitis C screening test -     HCV Ab w Reflex to Quant PCR  Dyslipidemia, goal LDL below 70 -     Lipid panel    Patient has been counseled on age-appropriate routine health concerns for screening and prevention. These are reviewed and up-to-date. Referrals have been placed accordingly. Immunizations are up-to-date or declined.    Subjective:   Chief Complaint  Patient presents with  . Establish Care   [name] 46 y.o. male presents to office today to establish care and for HFU.  He has a past medical history of DM 2 (12-15-2021)  HFU Admitted for 3 days with newly diagnosed DM. Blood glucose 522. He was treated with IV insulin and IVF. Hospital course complicated by hypokalemia requiring replacement and AKI Creatinine 1.4 and decreased to 0.8 upon discharge.  Today he states he is feeling somewhat better however the insulin does cause him to feel weird. Average blood glucose readings: 180-200s. He is uninsured. Will switch NPH 70/30 to humulin pen 70/30 administering 14 units BID. He  does not have a meter with him today.  Lab Results  Component Value Date   HGBA1C >15.5 (H) 12/16/2021      Review of Systems  Constitutional:  Negative for fever, malaise/fatigue and weight loss.  HENT: Negative.  Negative for nosebleeds.   Eyes: Negative.  Negative for blurred vision, double vision and photophobia.  Respiratory: Negative.  Negative for cough and shortness of breath.   Cardiovascular: Negative.  Negative for chest pain, palpitations and leg swelling.  Gastrointestinal: Negative.  Negative for heartburn, nausea and vomiting.  Musculoskeletal: Negative.  Negative for myalgias.  Neurological: Negative.  Negative for dizziness, focal weakness, seizures and headaches.  Psychiatric/Behavioral: Negative.  Negative for suicidal ideas.    Past Medical History:  Diagnosis Date  . Diabetes mellitus without complication [facility])     Past Surgical History:  Procedure Laterality Date  . EYE SURGERY     6 months old  . HERNIA REPAIR     as child    History reviewed. No pertinent family history.  Social History Reviewed with no changes to be made today.   Outpatient Medications Prior to Visit  Medication Sig Dispense Refill  . Flaxseed, Linseed, (FLAX SEEDS PO) Take 5 mLs by mouth daily.    . Naphazoline-Pheniramine (VISINE-A OP) Place 1 drop into both eyes 2 (two) times daily as needed (dry itchy eyes).    . insulin NPH-regular Human (70-30) 100 UNIT/ML injection inject 14 units into the skin  2 times daily 10 mL 16  . Insulin Pen Needle 31G X 8 MM MISC Use to inject insulin up to 4 times daily as directed. (Patient not taking: Reported on 01/24/2022) 100 each 0  . OVER THE COUNTER MEDICATION Take 2 capsules by mouth daily. Sea Manus Rudd (Patient not taking: Reported on 01/24/2022)     No facility-administered medications prior to visit.    No Known Allergies     Objective:    BP 112/79   Pulse 82   Wt 197 lb 9.6 oz (89.6 kg)   SpO2 99%   BMI 30.04 kg/m  Wt Readings  from Last 3 Encounters:  01/24/22 197 lb 9.6 oz (89.6 kg)  12/16/21 180 lb (81.6 kg)  03/25/14 205 lb (93 kg)    Physical Exam Vitals and nursing note reviewed.  Constitutional:      Appearance: He is well-developed.  HENT:     Head: Normocephalic and atraumatic.  Cardiovascular:     Rate and Rhythm: Normal rate and regular rhythm.     Heart sounds: Normal heart sounds. No murmur heard.   No friction rub. No gallop.  Pulmonary:     Effort: Pulmonary effort is normal. No tachypnea or respiratory distress.     Breath sounds: Normal breath sounds. No decreased breath sounds, wheezing, rhonchi or rales.  Chest:     Chest wall: No tenderness.  Abdominal:     General: Bowel sounds are normal.     Palpations: Abdomen is soft.  Musculoskeletal:        General: Normal range of motion.       Arms:     Cervical back: Normal range of motion.  Skin:    General: Skin is warm and dry.  Neurological:     Mental Status: He is alert and oriented to person, place, and time.     Coordination: Coordination normal.  Psychiatric:        Behavior: Behavior normal. Behavior is cooperative.        Thought Content: Thought content normal.        Judgment: Judgment normal.         Patient has been counseled extensively about nutrition and exercise as well as the importance of adherence with medications and regular follow-up. The patient was given clear instructions to go to ER or return to medical center if symptoms don't improve, worsen or new problems develop. The patient verbalized understanding.   Follow-up: Return for 6 weeks meter check with [facility]. See me in 3 months.   Gildardo Pounds, FNP-BC [facility] and [place], [place]   01/24/2022, 7:39 PM

## 2022-01-25 LAB — LIPID PANEL
Chol/HDL Ratio: 7.9 ratio — ABNORMAL HIGH (ref 0.0–5.0)
Cholesterol, Total: 260 mg/dL — ABNORMAL HIGH (ref 100–199)
HDL: 33 mg/dL — ABNORMAL LOW (ref >39)
LDL Chol Calc (NIH): 184 mg/dL — ABNORMAL HIGH (ref 0–99)
Triglycerides: 223 mg/dL — ABNORMAL HIGH (ref 0–149)
VLDL Cholesterol Cal: 43 mg/dL — ABNORMAL HIGH (ref 5–40)

## 2022-01-25 LAB — HCV INTERPRETATION

## 2022-01-25 LAB — HCV AB W REFLEX TO QUANT PCR: HCV Ab: NONREACTIVE

## 2022-01-26 ENCOUNTER — Other Ambulatory Visit: Payer: Self-pay | Admitting: Nurse Practitioner

## 2022-01-26 DIAGNOSIS — E785 Hyperlipidemia, unspecified: Secondary | ICD-10-CM

## 2022-01-26 MED ORDER — ATORVASTATIN CALCIUM 40 MG PO TABS
40.0000 mg | ORAL_TABLET | Freq: Every day | ORAL | 3 refills | Status: DC
  Filled 2022-01-26: qty 90, 90d supply, fill #0

## 2022-01-27 LAB — MICROALBUMIN / CREATININE URINE RATIO
Creatinine, Urine: 261.7 mg/dL
Microalb/Creat Ratio: 6 mg/g creat (ref 0–29)
Microalbumin, Urine: 15.3 ug/mL

## 2022-01-28 ENCOUNTER — Other Ambulatory Visit: Payer: Self-pay

## 2022-02-04 ENCOUNTER — Other Ambulatory Visit: Payer: Self-pay

## 2022-02-06 ENCOUNTER — Other Ambulatory Visit: Payer: Self-pay

## 2022-02-19 ENCOUNTER — Other Ambulatory Visit: Payer: Self-pay

## 2022-03-03 ENCOUNTER — Other Ambulatory Visit: Payer: Self-pay

## 2022-03-07 ENCOUNTER — Other Ambulatory Visit: Payer: Self-pay

## 2022-03-07 ENCOUNTER — Ambulatory Visit: Payer: Medicaid Other | Attending: Nurse Practitioner | Admitting: Pharmacist

## 2022-03-07 DIAGNOSIS — E1165 Type 2 diabetes mellitus with hyperglycemia: Secondary | ICD-10-CM

## 2022-03-07 DIAGNOSIS — Z794 Long term (current) use of insulin: Secondary | ICD-10-CM

## 2022-03-07 MED ORDER — INSULIN PEN NEEDLE 31G X 8 MM MISC
2 refills | Status: DC
  Filled 2022-03-07: qty 100, 25d supply, fill #0
  Filled 2022-03-30: qty 100, 25d supply, fill #1

## 2022-03-07 NOTE — Progress Notes (Signed)
S:     No chief complaint on file.  Brady Stephens is a 46 y.o. male who presents for diabetes evaluation, education, and management.   PMH is significant for T2DM.   Patient was referred and last seen by Primary Care Provider, Dr. Bertram Denver, on 01/24/2022. A1c from hospitalization in April of this year was >15%.  At last visit, no changes were made to DM medications. Atorvastatin was started but pt tells me today he stopped this d/t NV.   Today, patient arrives in good spirits and presents without any assistance.  Patient reports diabetes was diagnosed in this year. Denies any hx of CHF, ACS/CAD, stroke, or CKD. No hx of pancreatitis. No personal or fhx of thyroid cancer.   Family/Social History:  Fhx: MGF (diabetes) Tobacco: rare smoker (smokes cigars rarely). Stopped smoking ~15 years ago.  Alcohol: no consistent intake.   Current diabetes medications include: Humulin 70/30 14u BID Current hyperlipidemia medications include: atorvastatin 40 mg (not taking)  Patient reports adherence to insulin. He stopped his atorvastatin.  Insurance coverage: in the process of applying for Medicaid  Patient denies hypoglycemic events.   Patient denies nocturia (nighttime urination). Reports improvement.  Patient denies neuropathy (nerve pain). Reports improvement.  Patient denies visual changes. Reports impaired vision at time of dx.  Patient reports self foot exams.   Patient reported dietary habits: - Tries to avoid "white" and fried foods  - Increased intake of steamed vegetables - Admits to high intake of potatoes  - Increased intake of salmon, oatmeal - Has decreased his intake of sweets  - Sugar-sweetened beverages: some lemonade and decreased   Patient-reported exercise habits:  - Dropped ~30 lbs in 1 month leading to hospitalization  - Walks daily ~10-15 minutes  - Hurt on the job April of 2022; resigned S/t sciatic pain    O:   ROS  Physical Exam  7 day  average blood glucose: 105 30 day: 139 Lowest reading was 71.   Lab Results  Component Value Date   HGBA1C >15.5 (H) 12/16/2021   There were no vitals filed for this visit.  Lipid Panel     Component Value Date/Time   CHOL 260 (H) 01/24/2022 1000   TRIG 223 (H) 01/24/2022 1000   HDL 33 (L) 01/24/2022 1000   CHOLHDL 7.9 (H) 01/24/2022 1000   LDLCALC 184 (H) 01/24/2022 1000    Clinical Atherosclerotic Cardiovascular Disease (ASCVD): No  The 10-year ASCVD risk score (Arnett DK, et al., 2019) is: 8.7%   Values used to calculate the score:     Age: 79 years     Sex: Male     Is Non-Hispanic African American: Yes     Diabetic: Yes     Tobacco smoker: No     Systolic Blood Pressure: 112 mmHg     Is BP treated: No     HDL Cholesterol: 33 mg/dL     Total Cholesterol: 260 mg/dL   A/P: Diabetes dx this year currently uncontrolled based on A1c, however, home sugars are at goal. Patient is able to verbalize appropriate hypoglycemia management plan. Medication adherence appears appropriate. -Continued current insulin regimen for now. Would prefer to start once daily insulin but will wait until he hears back from medicaid. With his improved control, I anticipate his A1c to be much better at follow-up. At that time, we can consider modifying therapy.  -Extensively discussed pathophysiology of diabetes, recommended lifestyle interventions, dietary effects on blood sugar control.  -  Counseled on s/sx of and management of hypoglycemia.  -Next A1c anticipated 03/2022.   Written patient instructions provided. Patient verbalized understanding of treatment plan.  Total time in face to face counseling 20 minutes.    Follow-up:  PCP clinic visit in Aug.  Butch Penny, PharmD, Patsy [place], CPP Clinical Pharmacist [facility] [phone]

## 2022-03-10 ENCOUNTER — Other Ambulatory Visit: Payer: Self-pay

## 2022-03-11 LAB — FECAL OCCULT BLOOD, IMMUNOCHEMICAL: Fecal Occult Bld: NEGATIVE

## 2022-03-31 ENCOUNTER — Other Ambulatory Visit: Payer: Self-pay

## 2022-04-01 ENCOUNTER — Other Ambulatory Visit: Payer: Self-pay

## 2022-04-30 ENCOUNTER — Encounter: Payer: Self-pay | Admitting: Nurse Practitioner

## 2022-04-30 ENCOUNTER — Other Ambulatory Visit: Payer: Self-pay

## 2022-04-30 ENCOUNTER — Ambulatory Visit: Payer: Self-pay | Attending: Nurse Practitioner | Admitting: Nurse Practitioner

## 2022-04-30 VITALS — BP 118/73 | HR 75 | Temp 98.0°F | Ht 68.0 in | Wt 204.4 lb

## 2022-04-30 DIAGNOSIS — E1165 Type 2 diabetes mellitus with hyperglycemia: Secondary | ICD-10-CM

## 2022-04-30 DIAGNOSIS — Z794 Long term (current) use of insulin: Secondary | ICD-10-CM

## 2022-04-30 DIAGNOSIS — E785 Hyperlipidemia, unspecified: Secondary | ICD-10-CM

## 2022-04-30 LAB — POCT GLYCOSYLATED HEMOGLOBIN (HGB A1C): HbA1c, POC (controlled diabetic range): 6.6 % (ref 0.0–7.0)

## 2022-04-30 LAB — GLUCOSE, POCT (MANUAL RESULT ENTRY): POC Glucose: 139 mg/dl — AB (ref 70–99)

## 2022-04-30 MED ORDER — ROSUVASTATIN CALCIUM 10 MG PO TABS
10.0000 mg | ORAL_TABLET | Freq: Every day | ORAL | 3 refills | Status: DC
  Filled 2022-04-30: qty 30, 30d supply, fill #0

## 2022-04-30 MED ORDER — INSULIN PEN NEEDLE 31G X 8 MM MISC
2 refills | Status: DC
  Filled 2022-04-30: qty 100, 90d supply, fill #0
  Filled 2022-07-30: qty 100, 90d supply, fill #1
  Filled 2022-09-30 – 2022-10-01 (×2): qty 100, 90d supply, fill #2

## 2022-04-30 MED ORDER — BASAGLAR KWIKPEN 100 UNIT/ML ~~LOC~~ SOPN
20.0000 [IU] | PEN_INJECTOR | Freq: Every day | SUBCUTANEOUS | 1 refills | Status: DC
  Filled 2022-04-30: qty 6, 30d supply, fill #0
  Filled 2022-05-30: qty 6, 30d supply, fill #1
  Filled 2022-06-30: qty 6, 30d supply, fill #2
  Filled 2022-07-30: qty 6, 30d supply, fill #3

## 2022-04-30 MED ORDER — TRUEPLUS LANCETS 28G MISC
3 refills | Status: DC
  Filled 2022-04-30: qty 100, fill #0
  Filled 2022-07-30: qty 100, 50d supply, fill #0

## 2022-04-30 NOTE — Progress Notes (Signed)
Assessment & Plan:  Brady Stephens was seen today for diabetes.  Diagnoses and all orders for this visit:  Uncontrolled type 2 diabetes mellitus with hyperglycemia, with long-term current use of insulin (HCC) -     POCT glycosylated hemoglobin (Hb A1C) -     POCT glucose (manual entry) -     CMP14+EGFR -     Ambulatory referral to Ophthalmology -     Insulin Glargine (BASAGLAR KWIKPEN) 100 UNIT/ML; Inject 20 Units into the skin daily. -     TRUEplus Lancets 28G MISC; Use as instructed. Check blood glucose level by fingerstick two times per day. -     Insulin Pen Needle 31G X 8 MM MISC; Use as instructed. Inject into the skin once daily  Dyslipidemia, goal LDL below 70 -     Cancel: Lipid panel -     rosuvastatin (CRESTOR) 10 MG tablet; Take 1 tablet (10 mg total) by mouth daily.    Patient has been counseled on age-appropriate routine health concerns for screening and prevention. These are reviewed and up-to-date. Referrals have been placed accordingly. Immunizations are up-to-date or declined.    Subjective:   Chief Complaint  Patient presents with   Diabetes   Diabetes Pertinent negatives for hypoglycemia include no dizziness, headaches or seizures. Pertinent negatives for diabetes include no blurred vision, no chest pain and no weight loss.   Brady Stephens 46 y.o. male presents to office today for follow up to DM 2.   He has a past medical history of DM type 2, not at goal [facility]).    He has a past medical history of DM type 2, not at goal and HPL   DM 2 Diabetes has improved significantly. At this time will switch 70/30 to basalgar 20 units daily.  A1c down from >15 to 6.6 currently. He has made significant changes in his diet and lifestyle Lab Results  Component Value Date   HGBA1C 6.6 04/30/2022    Lab Results  Component Value Date   HGBA1C >15.5 (H) 12/16/2021     Lipids Not at goal. He had an intolerance to atorvastatin. Will switch to rosuvastatin at this time.   The 10-year ASCVD risk score (Arnett DK, et al., 2019) is: 9.5%   Values used to calculate the score:     Age: 77 years     Sex: Male     Is Non-Hispanic African American: Yes     Diabetic: Yes     Tobacco smoker: No     Systolic Blood Pressure: 161 mmHg     Is BP treated: No     HDL Cholesterol: 33 mg/dL     Total Cholesterol: 260 mg/dL  Review of Systems  Constitutional:  Negative for fever, malaise/fatigue and weight loss.  HENT: Negative.  Negative for nosebleeds.   Eyes: Negative.  Negative for blurred vision, double vision and photophobia.  Respiratory: Negative.  Negative for cough and shortness of breath.   Cardiovascular: Negative.  Negative for chest pain, palpitations and leg swelling.  Gastrointestinal: Negative.  Negative for heartburn, nausea and vomiting.  Musculoskeletal: Negative.  Negative for myalgias.  Neurological: Negative.  Negative for dizziness, focal weakness, seizures and headaches.  Psychiatric/Behavioral: Negative.  Negative for suicidal ideas.     Past Medical History:  Diagnosis Date   DM type 2, not at goal [facility])     Past Surgical History:  Procedure Laterality Date   EYE SURGERY     49 months old  HERNIA REPAIR     as child    No family history on file.  Social History Reviewed with no changes to be made today.   Outpatient Medications Prior to Visit  Medication Sig Dispense Refill   Blood Glucose Monitoring Suppl (TRUE METRIX METER) w/Device KIT Use as instructed. Check blood glucose level by fingerstick two times per day. 1 kit 0   glucose blood (TRUE METRIX BLOOD GLUCOSE TEST) test strip Use as instructed. Check blood glucose level by fingerstick twice per day. 100 each 12   Naphazoline-Pheniramine (VISINE-A OP) Place 1 drop into both eyes 2 (two) times daily as needed (dry itchy eyes).     insulin isophane & regular human KwikPen (HUMULIN 70/30 KWIKPEN) (70-30) 100 UNIT/ML KwikPen Inject 14 Units into the skin 2 (two) times daily. 9  mL 3   Insulin Pen Needle 31G X 8 MM MISC Use to inject insulin up to 4 times daily as directed. 100 each 2   TRUEplus Lancets 28G MISC Use as instructed. Check blood glucose level by fingerstick two times per day. 100 each 3   atorvastatin (LIPITOR) 40 MG tablet Take 1 tablet (40 mg total) by mouth daily. (Patient not taking: Reported on 04/30/2022) 90 tablet 3   Flaxseed, Linseed, (FLAX SEEDS PO) Take 5 mLs by mouth daily. (Patient not taking: Reported on 04/30/2022)     No facility-administered medications prior to visit.    No Known Allergies     Objective:    BP 118/73   Pulse 75   Temp 98 F (36.7 C) (Oral)   Ht _0  (1.727 m)   Wt 204 lb 6.4 oz (92.7 kg)   SpO2 98%   BMI 31.08 kg/m  Wt Readings from Last 3 Encounters:  04/30/22 204 lb 6.4 oz (92.7 kg)  01/24/22 197 lb 9.6 oz (89.6 kg)  12/16/21 180 lb (81.6 kg)    Physical Exam Vitals and nursing note reviewed.  Constitutional:      Appearance: He is well-developed.  HENT:     Head: Normocephalic and atraumatic.  Cardiovascular:     Rate and Rhythm: Normal rate and regular rhythm.     Heart sounds: Normal heart sounds. No murmur heard.    No friction rub. No gallop.  Pulmonary:     Effort: Pulmonary effort is normal. No tachypnea or respiratory distress.     Breath sounds: Normal breath sounds. No decreased breath sounds, wheezing, rhonchi or rales.  Chest:     Chest wall: No tenderness.  Abdominal:     General: Bowel sounds are normal.     Palpations: Abdomen is soft.  Musculoskeletal:        General: Normal range of motion.     Cervical back: Normal range of motion.  Skin:    General: Skin is warm and dry.  Neurological:     Mental Status: He is alert and oriented to person, place, and time.     Coordination: Coordination normal.  Psychiatric:        Behavior: Behavior normal. Behavior is cooperative.        Thought Content: Thought content normal.        Judgment: Judgment normal.           Patient has been counseled extensively about nutrition and exercise as well as the importance of adherence with medications and regular follow-up. The patient was given clear instructions to go to ER or return to medical center if symptoms don't improve, worsen  or new problems develop. The patient verbalized understanding.   Follow-up: Return in about 3 months (around 07/31/2022).   Gildardo Pounds, FNP-BC [facility] and [facility] [place], [place]   04/30/2022, 7:44 PM

## 2022-04-30 NOTE — Patient Instructions (Signed)
Decrease Basaglar by 2 units every 3 days for morning/fasting blood glucose levels less than 90.

## 2022-05-01 ENCOUNTER — Other Ambulatory Visit: Payer: Self-pay

## 2022-05-01 ENCOUNTER — Encounter: Payer: Self-pay | Admitting: Nurse Practitioner

## 2022-05-01 LAB — CMP14+EGFR
ALT: 20 IU/L (ref 0–44)
AST: 17 IU/L (ref 0–40)
Albumin/Globulin Ratio: 1.6 (ref 1.2–2.2)
Albumin: 4.5 g/dL (ref 4.1–5.1)
Alkaline Phosphatase: 78 IU/L (ref 44–121)
BUN/Creatinine Ratio: 10 (ref 9–20)
BUN: 10 mg/dL (ref 6–24)
Bilirubin Total: 0.2 mg/dL (ref 0.0–1.2)
CO2: 22 mmol/L (ref 20–29)
Calcium: 9.7 mg/dL (ref 8.7–10.2)
Chloride: 102 mmol/L (ref 96–106)
Creatinine, Ser: 1.03 mg/dL (ref 0.76–1.27)
Globulin, Total: 2.9 g/dL (ref 1.5–4.5)
Glucose: 109 mg/dL — ABNORMAL HIGH (ref 70–99)
Potassium: 4.7 mmol/L (ref 3.5–5.2)
Sodium: 141 mmol/L (ref 134–144)
Total Protein: 7.4 g/dL (ref 6.0–8.5)
eGFR: 90 mL/min/{1.73_m2} (ref >59)

## 2022-05-06 ENCOUNTER — Other Ambulatory Visit: Payer: Self-pay

## 2022-05-13 ENCOUNTER — Other Ambulatory Visit: Payer: Self-pay

## 2022-05-30 ENCOUNTER — Other Ambulatory Visit: Payer: Self-pay

## 2022-06-30 ENCOUNTER — Other Ambulatory Visit: Payer: Self-pay

## 2022-07-30 ENCOUNTER — Other Ambulatory Visit: Payer: Self-pay

## 2022-07-31 ENCOUNTER — Other Ambulatory Visit: Payer: Self-pay

## 2022-08-01 DIAGNOSIS — Z419 Encounter for procedure for purposes other than remedying health state, unspecified: Secondary | ICD-10-CM | POA: Diagnosis not present

## 2022-08-05 ENCOUNTER — Ambulatory Visit: Payer: Medicaid Other | Attending: Nurse Practitioner | Admitting: Nurse Practitioner

## 2022-08-05 ENCOUNTER — Encounter: Payer: Self-pay | Admitting: Nurse Practitioner

## 2022-08-05 ENCOUNTER — Other Ambulatory Visit: Payer: Self-pay

## 2022-08-05 ENCOUNTER — Other Ambulatory Visit: Payer: Self-pay | Admitting: Pharmacist

## 2022-08-05 VITALS — BP 125/75 | HR 80 | Temp 98.2°F | Ht 68.0 in | Wt 210.9 lb

## 2022-08-05 DIAGNOSIS — E1165 Type 2 diabetes mellitus with hyperglycemia: Secondary | ICD-10-CM

## 2022-08-05 DIAGNOSIS — M151 Heberden's nodes (with arthropathy): Secondary | ICD-10-CM | POA: Diagnosis not present

## 2022-08-05 DIAGNOSIS — E785 Hyperlipidemia, unspecified: Secondary | ICD-10-CM

## 2022-08-05 DIAGNOSIS — Z794 Long term (current) use of insulin: Secondary | ICD-10-CM

## 2022-08-05 DIAGNOSIS — G8929 Other chronic pain: Secondary | ICD-10-CM

## 2022-08-05 DIAGNOSIS — M2550 Pain in unspecified joint: Secondary | ICD-10-CM

## 2022-08-05 DIAGNOSIS — M5441 Lumbago with sciatica, right side: Secondary | ICD-10-CM

## 2022-08-05 LAB — POCT GLYCOSYLATED HEMOGLOBIN (HGB A1C): HbA1c, POC (controlled diabetic range): 6.8 % (ref 0.0–7.0)

## 2022-08-05 MED ORDER — MELOXICAM 15 MG PO TABS
15.0000 mg | ORAL_TABLET | Freq: Every day | ORAL | 0 refills | Status: DC
  Filled 2022-08-05: qty 30, 30d supply, fill #0

## 2022-08-05 MED ORDER — TRUE METRIX BLOOD GLUCOSE TEST VI STRP
ORAL_STRIP | 12 refills | Status: DC
  Filled 2022-08-05: qty 100, fill #0

## 2022-08-05 MED ORDER — ACCU-CHEK SOFTCLIX LANCETS MISC
2 refills | Status: AC
  Filled 2022-08-05: qty 100, fill #0
  Filled 2022-09-30: qty 100, 50d supply, fill #0
  Filled 2022-11-16: qty 100, 50d supply, fill #1
  Filled 2023-04-30: qty 100, 50d supply, fill #2

## 2022-08-05 MED ORDER — METHOCARBAMOL 750 MG PO TABS
750.0000 mg | ORAL_TABLET | Freq: Three times a day (TID) | ORAL | 3 refills | Status: DC
  Filled 2022-08-05: qty 90, 30d supply, fill #0

## 2022-08-05 MED ORDER — LANTUS SOLOSTAR 100 UNIT/ML ~~LOC~~ SOPN
20.0000 [IU] | PEN_INJECTOR | Freq: Every day | SUBCUTANEOUS | 1 refills | Status: DC
  Filled 2022-08-05 – 2022-08-28 (×2): qty 15, 75d supply, fill #0
  Filled 2022-11-16: qty 15, 75d supply, fill #1

## 2022-08-05 MED ORDER — ACCU-CHEK GUIDE W/DEVICE KIT
PACK | 0 refills | Status: DC
  Filled 2022-08-05: qty 1, fill #0
  Filled 2022-10-01 – 2022-10-02 (×2): qty 1, 30d supply, fill #0

## 2022-08-05 MED ORDER — ACCU-CHEK GUIDE VI STRP
ORAL_STRIP | 12 refills | Status: AC
  Filled 2022-08-05: qty 100, fill #0
  Filled 2022-09-30: qty 100, 50d supply, fill #0
  Filled 2022-11-16: qty 100, 50d supply, fill #1
  Filled 2023-04-30: qty 100, 50d supply, fill #2
  Filled 2023-07-07: qty 100, 50d supply, fill #3

## 2022-08-05 MED ORDER — ROSUVASTATIN CALCIUM 10 MG PO TABS
10.0000 mg | ORAL_TABLET | Freq: Every day | ORAL | 3 refills | Status: DC
  Filled 2022-08-05: qty 90, 90d supply, fill #0

## 2022-08-05 MED ORDER — TRUEPLUS LANCETS 28G MISC
3 refills | Status: DC
  Filled 2022-08-05: qty 100, fill #0

## 2022-08-05 MED ORDER — BASAGLAR KWIKPEN 100 UNIT/ML ~~LOC~~ SOPN
20.0000 [IU] | PEN_INJECTOR | Freq: Every day | SUBCUTANEOUS | 1 refills | Status: DC
  Filled 2022-08-05: qty 18, 90d supply, fill #0

## 2022-08-05 NOTE — Progress Notes (Signed)
Referral to ortho , pt has right hip and back pain.

## 2022-08-05 NOTE — Progress Notes (Signed)
 Assessment & Plan:  Valerie was seen today for diabetes.  Diagnoses and all orders for this visit:  Uncontrolled type 2 diabetes mellitus with hyperglycemia, with long-term current use of insulin (HCC) -     CMP14+EGFR -     POCT glycosylated hemoglobin (Hb A1C) -     Discontinue: Insulin Glargine (BASAGLAR KWIKPEN) 100 UNIT/ML; Inject 20 Units into the skin daily. -     Discontinue: glucose blood (TRUE METRIX BLOOD GLUCOSE TEST) test strip; Use as instructed. Check blood glucose level by fingerstick twice per day. -     Discontinue: TRUEplus Lancets 28G MISC; Use as instructed. Check blood glucose level by fingerstick two times per day. Continue blood sugar control as discussed in office today, low carbohydrate diet, and regular physical exercise as tolerated, 150 minutes per week (30 min each day, 5 days per week, or 50 min 3 days per week). Keep blood sugar logs with fasting goal of 90-130 mg/dl, post prandial (after you eat) less than 180.  For Hypoglycemia: BS <60 and Hyperglycemia BS >400; contact the clinic ASAP. Annual eye exams and foot exams are recommended.   Dyslipidemia, goal LDL below 70 -     Lipid panel -     rosuvastatin (CRESTOR) 10 MG tablet; Take 1 tablet (10 mg total) by mouth daily for cholesterol INSTRUCTIONS: Work on a low fat, heart healthy diet and participate in regular aerobic exercise program by working out at least 150 minutes per week; 5 days a week-30 minutes per day. Avoid red meat/beef/steak,  fried foods. junk foods, sodas, sugary drinks, unhealthy snacking, alcohol and smoking.  Drink at least 80 oz of water per day and monitor your carbohydrate intake daily.    Chronic right-sided low back pain with right-sided sciatica -     meloxicam (MOBIC) 15 MG tablet; Take 1 tablet (15 mg total) by mouth daily for back pain -     methocarbamol (ROBAXIN) 750 MG tablet; Take 1 tablet (750 mg total) by mouth 3 (three) times daily. For muscle spasms -     Ambulatory  referral to Orthopedic Surgery  Arthralgia of multiple joints -     ANA,IFA RA Diag Pnl w/rflx Tit/Patn  Heberden's nodes of both hands -     meloxicam (MOBIC) 15 MG tablet; Take 1 tablet (15 mg total) by mouth daily for back pain -     ANA,IFA RA Diag Pnl w/rflx Tit/Patn    Patient has been counseled on age-appropriate routine health concerns for screening and prevention. These are reviewed and up-to-date. Referrals have been placed accordingly. Immunizations are up-to-date or declined.    Subjective:   Chief Complaint  Patient presents with   Diabetes   [name] 46 y.o. male presents to office today for follow up to DM. He has complaints of multiple joint arthralgias. Although ANA/RA panel were negative he would still like a referral to the orthopedist. I did recommend weight loss and exercise. He does not want to take NSAIDs long term or anything that will affect his kidney or liver. I have currently prescribed meloxicam and methocarbamol. States he was hurt on the job last year after a locker was knocked over and hit him in his shoulders, back, hips and legs. States his back has gone out several times. Feels pressure build up in his leg when he is sitting down and he has to stick his leg out to relieve the pressure.    DM Well controlled. He is   administering lantus 20 units daily Lab Results  Component Value Date   HGBA1C 6.8 08/05/2022  LDL not at goal. He has been prescribed rosuvastatin 10 mg daily.  Lab Results  Component Value Date   LDLCALC 211 (H) 08/05/2022     The 10-year ASCVD risk score (Arnett DK, et al., 2019) is: 10.6%   Values used to calculate the score:     Age: 46 years     Sex: Male     Is Non-Hispanic African American: Yes     Diabetic: Yes     Tobacco smoker: No     Systolic Blood Pressure: 125 mmHg     Is BP treated: No     HDL Cholesterol: 33 mg/dL     Total Cholesterol: 266 mg/dL     Review of Systems  Constitutional:  Negative  for fever, malaise/fatigue and weight loss.  HENT: Negative.  Negative for nosebleeds.   Eyes: Negative.  Negative for blurred vision, double vision and photophobia.  Respiratory: Negative.  Negative for cough and shortness of breath.   Cardiovascular: Negative.  Negative for chest pain, palpitations and leg swelling.  Gastrointestinal: Negative.  Negative for heartburn, nausea and vomiting.  Musculoskeletal:  Positive for back pain and joint pain. Negative for myalgias.  Neurological: Negative.  Negative for dizziness, focal weakness, seizures and headaches.  Psychiatric/Behavioral: Negative.  Negative for suicidal ideas.     Past Medical History:  Diagnosis Date   DM type 2, not at goal (HCC)     Past Surgical History:  Procedure Laterality Date   EYE SURGERY     6 months old   HERNIA REPAIR     as child    History reviewed. No pertinent family history.  Social History Reviewed with no changes to be made today.   Outpatient Medications Prior to Visit  Medication Sig Dispense Refill   Insulin Pen Needle 31G X 8 MM MISC Use as instructed. Inject into the skin once daily 100 each 2   Naphazoline-Pheniramine (VISINE-A OP) Place 1 drop into both eyes 2 (two) times daily as needed (dry itchy eyes).     Blood Glucose Monitoring Suppl (TRUE METRIX METER) w/Device KIT Use as instructed. Check blood glucose level by fingerstick two times per day. 1 kit 0   glucose blood (TRUE METRIX BLOOD GLUCOSE TEST) test strip Use as instructed. Check blood glucose level by fingerstick twice per day. 100 each 12   Insulin Glargine (BASAGLAR KWIKPEN) 100 UNIT/ML Inject 20 Units into the skin daily. 18 mL 1   TRUEplus Lancets 28G MISC Use as instructed. Check blood glucose level by fingerstick two times per day. 100 each 3   rosuvastatin (CRESTOR) 10 MG tablet Take 1 tablet (10 mg total) by mouth daily. (Patient not taking: Reported on 08/05/2022) 90 tablet 3   No facility-administered medications prior  to visit.    No Known Allergies     Objective:    BP 125/75   Pulse 80   Temp 98.2 F (36.8 C) (Temporal)   Ht 5' 8" (1.727 m)   Wt 210 lb 14.4 oz (95.7 kg)   SpO2 99%   BMI 32.07 kg/m  Wt Readings from Last 3 Encounters:  08/05/22 210 lb 14.4 oz (95.7 kg)  04/30/22 204 lb 6.4 oz (92.7 kg)  01/24/22 197 lb 9.6 oz (89.6 kg)    Physical Exam Vitals and nursing note reviewed.  Constitutional:      Appearance: He is well-developed.  HENT:       Head: Normocephalic and atraumatic.  Cardiovascular:     Rate and Rhythm: Normal rate and regular rhythm.     Heart sounds: Normal heart sounds. No murmur heard.    No friction rub. No gallop.  Pulmonary:     Effort: Pulmonary effort is normal. No tachypnea or respiratory distress.     Breath sounds: Normal breath sounds. No decreased breath sounds, wheezing, rhonchi or rales.  Chest:     Chest wall: No tenderness.  Abdominal:     General: Bowel sounds are normal.     Palpations: Abdomen is soft.  Musculoskeletal:        General: Normal range of motion.     Cervical back: Normal range of motion.  Skin:    General: Skin is warm and dry.  Neurological:     Mental Status: He is alert and oriented to person, place, and time.     Coordination: Coordination normal.  Psychiatric:        Behavior: Behavior normal. Behavior is cooperative.        Thought Content: Thought content normal.        Judgment: Judgment normal.          Patient has been counseled extensively about nutrition and exercise as well as the importance of adherence with medications and regular follow-up. The patient was given clear instructions to go to ER or return to medical center if symptoms don't improve, worsen or new problems develop. The patient verbalized understanding.   Follow-up: Return in about 6 months (around 02/13/2023).   Gildardo Pounds, FNP-BC [facility] and [place], [place]   08/10/2022, 10:54  PM

## 2022-08-06 ENCOUNTER — Other Ambulatory Visit: Payer: Self-pay

## 2022-08-07 LAB — LIPID PANEL
Chol/HDL Ratio: 8.1 ratio — ABNORMAL HIGH (ref 0.0–5.0)
Cholesterol, Total: 266 mg/dL — ABNORMAL HIGH (ref 100–199)
HDL: 33 mg/dL — ABNORMAL LOW (ref >39)
LDL Chol Calc (NIH): 211 mg/dL — ABNORMAL HIGH (ref 0–99)
Triglycerides: 120 mg/dL (ref 0–149)
VLDL Cholesterol Cal: 22 mg/dL (ref 5–40)

## 2022-08-07 LAB — CMP14+EGFR
ALT: 17 IU/L (ref 0–44)
AST: 16 IU/L (ref 0–40)
Albumin/Globulin Ratio: 1.6 (ref 1.2–2.2)
Albumin: 4.5 g/dL (ref 4.1–5.1)
Alkaline Phosphatase: 85 IU/L (ref 44–121)
BUN/Creatinine Ratio: 8 — ABNORMAL LOW (ref 9–20)
BUN: 8 mg/dL (ref 6–24)
Bilirubin Total: 0.4 mg/dL (ref 0.0–1.2)
CO2: 25 mmol/L (ref 20–29)
Calcium: 9.5 mg/dL (ref 8.7–10.2)
Chloride: 101 mmol/L (ref 96–106)
Creatinine, Ser: 0.98 mg/dL (ref 0.76–1.27)
Globulin, Total: 2.8 g/dL (ref 1.5–4.5)
Glucose: 121 mg/dL — ABNORMAL HIGH (ref 70–99)
Potassium: 4.2 mmol/L (ref 3.5–5.2)
Sodium: 141 mmol/L (ref 134–144)
Total Protein: 7.3 g/dL (ref 6.0–8.5)
eGFR: 95 mL/min/{1.73_m2} (ref >59)

## 2022-08-07 LAB — ANA,IFA RA DIAG PNL W/RFLX TIT/PATN
ANA Titer 1: NEGATIVE
Cyclic Citrullin Peptide Ab: <1 units (ref 0–19)
Rheumatoid fact SerPl-aCnc: <10 IU/mL (ref <14.0)

## 2022-08-10 ENCOUNTER — Other Ambulatory Visit: Payer: Self-pay | Admitting: Nurse Practitioner

## 2022-08-10 ENCOUNTER — Encounter: Payer: Self-pay | Admitting: Nurse Practitioner

## 2022-08-10 DIAGNOSIS — M151 Heberden's nodes (with arthropathy): Secondary | ICD-10-CM

## 2022-08-10 DIAGNOSIS — M2550 Pain in unspecified joint: Secondary | ICD-10-CM

## 2022-08-13 ENCOUNTER — Other Ambulatory Visit: Payer: Self-pay | Admitting: Nurse Practitioner

## 2022-08-13 ENCOUNTER — Telehealth: Payer: Self-pay | Admitting: Emergency Medicine

## 2022-08-13 DIAGNOSIS — G8929 Other chronic pain: Secondary | ICD-10-CM

## 2022-08-13 NOTE — Telephone Encounter (Signed)
Copied from CRM [id number]. Topic: Referral - Status >> Aug 13, 2022 10:48 AM Macon Large wrote: Reason for CRM: Pt stated he contacted Dr. Marshell Levan office to get an appt but he was told no referral was sent to them. Pt requests that the referral be sent to Dr. Yevette Edwards Cb# [phone]

## 2022-08-13 NOTE — Telephone Encounter (Signed)
Referral has been placed to [facility]

## 2022-08-20 ENCOUNTER — Ambulatory Visit (INDEPENDENT_AMBULATORY_CARE_PROVIDER_SITE_OTHER): Payer: Medicaid Other

## 2022-08-20 ENCOUNTER — Ambulatory Visit: Payer: Self-pay

## 2022-08-20 ENCOUNTER — Ambulatory Visit: Payer: Medicaid Other | Admitting: Orthopaedic Surgery

## 2022-08-20 DIAGNOSIS — M79641 Pain in right hand: Secondary | ICD-10-CM

## 2022-08-20 DIAGNOSIS — M79642 Pain in left hand: Secondary | ICD-10-CM

## 2022-08-20 NOTE — Progress Notes (Signed)
Office Visit Note   Patient: Brady Stephens           Date of Birth: [birth year]           MRN: [id number] Visit Date: 08/20/2022              Requested by: Claiborne Rigg, NP [address] [place],  [place] [address] PCP: Claiborne Rigg, NP   Assessment & Plan: Visit Diagnoses:  1. 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  In regards arthritis, he is already on meloxicam.  I have discussed adding topical Voltaren and have provided him with a handout.  In regards to the possible carpal tunnel syndrome, made referral to Dr. Alvester Morin for nerve conduction study/EMG.  He will follow-up with [place] once this is been completed.  In the meantime, we have provided him with night splints.  Call with concerns or questions.   Follow-Up Instructions: Return for f/u after NCS/EMG.   Orders:  Orders Placed This Encounter  Procedures   XR Hand Complete Right   XR Hand Complete Left   Ambulatory referral to Physical Medicine Rehab   No orders of the defined types were placed in this encounter.     Procedures: No procedures performed   Clinical Data: No additional findings.   Subjective: Chief Complaint  Patient presents with   Right Hand - Pain   Left Hand - Pain    HPI patient is a very pleasant 46 year old right-hand-dominant gentleman who comes in today with bilateral hand pain and paresthesias.  Symptoms began about 7 years ago and have progressively worsened over the past year.  He notes that he has done a lot of work with his hands over the years.  All of his pain is to the DIP joints.  Pain is constant but worse with any movement such as trying to put on his socks or shoes.  The paresthesias he gets are primarily occur to the index, long and ring fingers and occur at night when he is sleeping as well as when he is driving.  He has been taking meloxicam which does  provide some relief.  No previous cortisone injection in his hands.  Review of Systems as detailed in HPI.  All others reviewed are negative.   Objective: Vital Signs: There were no vitals taken for this visit.  Physical Exam well-developed well-nourished gentleman in no acute distress.  Alert and oriented x 3.  Ortho Exam bilateral hand exam reveals diffuse tenderness to the DIP joints of all 10 fingers.  Positive Phalen and positive Tinel at the wrist both sides.  No thenar atrophy.  He is neurovascular intact distally.  Specialty Comments:  No specialty comments available.  Imaging: XR Hand Complete Left  Result Date: 08/20/2022 X-rays demonstrate advanced degenerative changes to the DIP joints  XR Hand Complete Right  Result Date: 08/20/2022 X-rays demonstrate advanced degenerative changes to the DIP joints    PMFS History: Patient Active Problem List   Diagnosis Date Noted   Uncontrolled type 2 diabetes mellitus with hyperglycemia, with long-term current use of insulin (HCC) 12/16/2021   Past Medical History:  Diagnosis Date   DM type 2, not at goal [facility])     No family history on file.  Past Surgical History:  Procedure Laterality Date   EYE SURGERY     68 months old  HERNIA REPAIR     as child   Social History   Occupational History   Not on file  Tobacco Use   Smoking status: Never   Smokeless tobacco: Not on file  Vaping Use   Vaping Use: Never used  Substance and Sexual Activity   Alcohol use: Not Currently   Drug use: No   Sexual activity: Not on file

## 2022-08-26 ENCOUNTER — Other Ambulatory Visit: Payer: Self-pay | Admitting: Nurse Practitioner

## 2022-08-26 ENCOUNTER — Encounter: Payer: Self-pay | Admitting: Nurse Practitioner

## 2022-08-26 ENCOUNTER — Other Ambulatory Visit: Payer: Self-pay

## 2022-08-26 DIAGNOSIS — E785 Hyperlipidemia, unspecified: Secondary | ICD-10-CM

## 2022-08-26 MED ORDER — PRAVASTATIN SODIUM 20 MG PO TABS
20.0000 mg | ORAL_TABLET | Freq: Every day | ORAL | 3 refills | Status: DC
  Filled 2022-08-26: qty 30, 30d supply, fill #0

## 2022-08-28 ENCOUNTER — Other Ambulatory Visit: Payer: Self-pay

## 2022-08-29 ENCOUNTER — Other Ambulatory Visit: Payer: Self-pay

## 2022-09-01 DIAGNOSIS — Z419 Encounter for procedure for purposes other than remedying health state, unspecified: Secondary | ICD-10-CM | POA: Diagnosis not present

## 2022-09-05 ENCOUNTER — Ambulatory Visit (INDEPENDENT_AMBULATORY_CARE_PROVIDER_SITE_OTHER): Payer: Medicaid Other | Admitting: Physical Medicine and Rehabilitation

## 2022-09-05 DIAGNOSIS — M79641 Pain in right hand: Secondary | ICD-10-CM | POA: Diagnosis not present

## 2022-09-05 DIAGNOSIS — M79642 Pain in left hand: Secondary | ICD-10-CM | POA: Diagnosis not present

## 2022-09-05 DIAGNOSIS — R202 Paresthesia of skin: Secondary | ICD-10-CM | POA: Diagnosis not present

## 2022-09-06 ENCOUNTER — Encounter: Payer: Self-pay | Admitting: Nurse Practitioner

## 2022-09-08 ENCOUNTER — Telehealth: Payer: Self-pay | Admitting: Orthopaedic Surgery

## 2022-09-08 NOTE — Telephone Encounter (Signed)
Re-faxed request for medical records to [facility] [phone], initially faxed 08/19/2022.

## 2022-09-09 ENCOUNTER — Other Ambulatory Visit: Payer: Self-pay | Admitting: Nurse Practitioner

## 2022-09-09 DIAGNOSIS — G8929 Other chronic pain: Secondary | ICD-10-CM

## 2022-09-09 NOTE — Procedures (Signed)
EMG & NCV Findings: Evaluation of the left median (across palm) sensory nerve showed prolonged distal peak latency (Wrist, 3.7 ms) and prolonged distal peak latency (Palm, 2.1 ms).  The right median (across palm) sensory nerve showed prolonged distal peak latency (Wrist, 3.8 ms).  The left ulnar sensory nerve showed prolonged distal peak latency (3.8 ms) and decreased conduction velocity (Wrist-5th Digit, 37 m/s).  All remaining nerves (as indicated in the following tables) were within normal limits.  Left vs. Right side comparison data for the ulnar motor nerve indicates abnormal L-R amplitude difference (34.3 %).  All remaining left vs. right side differences were within normal limits.    All examined muscles (as indicated in the following table) showed no evidence of electrical instability.    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   There is no significant electrodiagnostic evidence of any other focal nerve entrapment, brachial plexopathy or cervical radiculopathy.   Recommendations: 1.  Follow-up with referring physician. 2.  Continue current management of symptoms. 3.  Continue use of resting splint at night-time and as needed during the day.  ___________________________ Elease Hashimoto Board Certified, American Board of Physical Medicine and Rehabilitation    Nerve Conduction Studies Anti Sensory Summary Table   Stim Site NR Peak (ms) Norm Peak (ms) P-T Amp (V) Norm P-T Amp Site1 Site2 Delta-P (ms) Dist (cm) Vel (m/s) Norm Vel (m/s)  Left Median Acr Palm Anti Sensory (2nd Digit)  30C  Wrist    *3.7 <3.6 23.4 >10 Wrist Palm 1.6 0.0    Palm    *2.1 <2.0 22.2         Right Median Acr Palm Anti Sensory (2nd Digit)  30.8C  Wrist    *3.8 <3.6 22.6 >10 Wrist Palm 1.9 0.0    Palm    1.9 <2.0 19.2         Left Radial Anti Sensory  (Base 1st Digit)  29.4C  Wrist    2.6 <3.1 21.9  Wrist Base 1st Digit 2.6 0.0    Right Radial Anti Sensory (Base 1st Digit)  30.7C  Wrist    2.4 <3.1 24.4  Wrist Base 1st Digit 2.4 0.0    Left Ulnar Anti Sensory (5th Digit)  29.8C  Wrist    *3.8 <3.7 22.9 >15.0 Wrist 5th Digit 3.8 14.0 *37 >38  Right Ulnar Anti Sensory (5th Digit)  30.9C  Wrist    3.7 <3.7 20.3 >15.0 Wrist 5th Digit 3.7 14.0 38 >38   Motor Summary Table   Stim Site NR Onset (ms) Norm Onset (ms) O-P Amp (mV) Norm O-P Amp Site1 Site2 Delta-0 (ms) Dist (cm) Vel (m/s) Norm Vel (m/s)  Left Median Motor (Abd Poll Brev)  29.3C  Wrist    4.0 <4.2 9.1 >5 Elbow Wrist 4.2 23.0 55 >50  Elbow    8.2  8.1         Right Median Motor (Abd Poll Brev)  30.8C  Wrist    3.9 <4.2 7.8 >5 Elbow Wrist 4.7 24.5 52 >50  Elbow    8.6  4.3         Left Ulnar Motor (Abd Dig Min)  29.6C  Wrist    3.8 <4.2 6.5 >3 B Elbow Wrist 3.9 22.0 56 >53  B Elbow    7.7  6.8  A Elbow B Elbow 1.6 10.0 62 >53  A Elbow    9.3  5.3         Right Ulnar Motor (Abd Dig Min)  30.9C  Wrist    3.2 <4.2 9.9 >3 B Elbow Wrist 3.8 23.5 62 >53  B Elbow    7.0  9.5  A Elbow B Elbow 1.4 10.0 71 >53  A Elbow    8.4  10.1          EMG   Side Muscle Nerve Root Ins Act Fibs Psw Amp Dur Poly Recrt Int Fraser Din Comment  Right Abd Poll Brev Median C8-T1 Nml Nml Nml Nml Nml 0 Nml Nml   Right 1stDorInt Ulnar C8-T1 Nml Nml Nml Nml Nml 0 Nml Nml   Right PronatorTeres Median C6-7 Nml Nml Nml Nml Nml 0 Nml Nml   Right Biceps Musculocut C5-6 Nml Nml Nml Nml Nml 0 Nml Nml   Right Deltoid Axillary C5-6 Nml Nml Nml Nml Nml 0 Nml Nml     Nerve Conduction Studies Anti Sensory Left/Right Comparison   Stim Site L Lat (ms) R Lat (ms) L-R Lat (ms) L Amp (V) R Amp (V) L-R Amp (%) Site1 Site2 L Vel (m/s) R Vel (m/s) L-R Vel (m/s)  Median Acr Palm Anti Sensory (2nd Digit)  30C  Wrist *3.7 *3.8 0.1 23.4 22.6 3.4 Wrist Palm     Palm *2.1 1.9 0.2 22.2 19.2 13.5       Radial Anti Sensory  (Base 1st Digit)  29.4C  Wrist 2.6 2.4 0.2 21.9 24.4 10.2 Wrist Base 1st Digit     Ulnar Anti Sensory (5th Digit)  29.8C  Wrist *3.8 3.7 0.1 22.9 20.3 11.4 Wrist 5th Digit *37 38 1   Motor Left/Right Comparison   Stim Site L Lat (ms) R Lat (ms) L-R Lat (ms) L Amp (mV) R Amp (mV) L-R Amp (%) Site1 Site2 L Vel (m/s) R Vel (m/s) L-R Vel (m/s)  Median Motor (Abd Poll Brev)  29.3C  Wrist 4.0 3.9 0.1 9.1 7.8 14.3 Elbow Wrist 55 52 3  Elbow 8.2 8.6 0.4 8.1 4.3 46.9       Ulnar Motor (Abd Dig Min)  29.6C  Wrist 3.8 3.2 0.6 6.5 9.9 *34.3 B Elbow Wrist 56 62 6  B Elbow 7.7 7.0 0.7 6.8 9.5 28.4 A Elbow B Elbow 62 71 9  A Elbow 9.3 8.4 0.9 5.3 10.1 47.5          Waveforms:

## 2022-09-09 NOTE — Progress Notes (Signed)
Brady Stephens - 47 y.o. male MRN [id number]  Date of birth: Nov 13, 1975  Office Visit Note: Visit Date: 09/05/2022 PCP: Claiborne Rigg, NP Referred by: Tarry Kos, MD  Subjective: Chief Complaint  Patient presents with   Right Hand - Numbness, Pain    Swelling   Left Hand - Numbness, Pain    Swelling   Right Foot - Numbness, Pain    Swelling   Left Foot - Numbness, Pain    Swelling   HPI: Brady Stephens is a 47 y.o. male who comes in today at the request of Dr. Glee Arvin for evaluation and management chronic worsening pain numbness and tingling in the bilateral upper extremities.  Patient is Right hand dominant.  He reports using his hands quite a bit with work over the years and has a lot of pain in the DIP joints.  He does get some tingling numbness in the predominantly radial digits but also whole hand nondermatomal particularly at night.  His biggest complaint is the pain in the hands worse with activity.  Has not responded to meloxicam and anti-inflammatories and bracing.  His history is complicated by type 2 diabetes with very high hemoglobin A1c in August of this year over 15.  Denies any frank radicular symptoms.  Has had no prior electrodiagnostic study.      Review of Systems  Musculoskeletal:  Positive for joint pain.  Neurological:  Positive for tingling and weakness.  All other systems reviewed and are negative.  Otherwise per HPI.  Assessment & Plan: Visit Diagnoses:    ICD-10-CM   1. Paresthesia of skin  R20.2 NCV with EMG (electromyography)    2. Bilateral hand pain  M79.641    M79.642        Plan: Impression: Significant amount of osteoarthritic type pain in the hands with some tingling numbness that could be carpal tunnel syndrome.  Decided today to complete electrodiagnostic study.  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    There is no significant electrodiagnostic evidence of any other focal nerve entrapment, brachial plexopathy or cervical radiculopathy.  While there is no evidence of underlying polyneuropathy this would likely start in the feet and if felt to be warranted a 3 limb study could be performed with neurology consultation.   Recommendations: 1.  Follow-up with referring physician. 2.  Continue current management of low a symptoms. 3.  Continue use of resting splint at night-time and as needed during the day.    Meds & Orders: No orders of the defined types were placed in this encounter.   Orders Placed This Encounter  Procedures   NCV with EMG (electromyography)    Follow-up: Return in about 2 weeks (around 09/19/2022) for  Glee Arvin, MD.   Procedures: No procedures performed  EMG & NCV Findings: Evaluation of the left median (across palm) sensory nerve showed prolonged distal peak latency (Wrist, 3.7 ms) and prolonged distal peak latency (Palm, 2.1 ms).  The right median (across palm) sensory nerve showed prolonged distal peak latency (Wrist, 3.8 ms).  The left ulnar sensory nerve showed prolonged distal peak latency (3.8 ms) and decreased conduction velocity (Wrist-5th Digit, 37 m/s).  All remaining nerves (as indicated in the following tables) were within normal limits.  Left vs. Right side comparison data for the ulnar motor nerve indicates abnormal L-R amplitude difference (34.3 %).  All remaining left vs. right side differences were within normal limits.    All examined muscles (as indicated in the following table) showed no evidence of electrical instability.    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   There is no significant electrodiagnostic evidence of any other focal nerve entrapment, brachial plexopathy or cervical  radiculopathy.   Recommendations: 1.  Follow-up with referring physician. 2.  Continue current management of symptoms. 3.  Continue use of resting splint at night-time and as needed during the day.  ___________________________ Wonda Olds Board Certified, American Board of Physical Medicine and Rehabilitation    Nerve Conduction Studies Anti Sensory Summary Table   Stim Site NR Peak (ms) Norm Peak (ms) P-T Amp (V) Norm P-T Amp Site1 Site2 Delta-P (ms) Dist (cm) Vel (m/s) Norm Vel (m/s)  Left Median Acr Palm Anti Sensory (2nd Digit)  30C  Wrist    *3.7 <3.6 23.4 >10 Wrist Palm 1.6 0.0    Palm    *2.1 <2.0 22.2         Right Median Acr Palm Anti Sensory (2nd Digit)  30.8C  Wrist    *3.8 <3.6 22.6 >10 Wrist Palm 1.9 0.0    Palm    1.9 <2.0 19.2         Left Radial Anti Sensory (Base 1st Digit)  29.4C  Wrist    2.6 <3.1 21.9  Wrist Base 1st Digit 2.6 0.0    Right Radial Anti Sensory (Base 1st Digit)  30.7C  Wrist    2.4 <3.1 24.4  Wrist Base 1st Digit 2.4 0.0    Left Ulnar Anti Sensory (5th Digit)  29.8C  Wrist    *3.8 <3.7 22.9 >15.0 Wrist 5th Digit 3.8 14.0 *37 >38  Right Ulnar Anti Sensory (5th Digit)  30.9C  Wrist    3.7 <3.7 20.3 >15.0 Wrist 5th Digit 3.7 14.0 38 >38   Motor Summary Table   Stim Site NR Onset (ms) Norm Onset (ms) O-P Amp (mV) Norm O-P Amp Site1 Site2 Delta-0 (ms) Dist (cm) Vel (m/s) Norm Vel (m/s)  Left Median Motor (Abd Poll Brev)  29.3C  Wrist    4.0 <4.2 9.1 >5 Elbow Wrist 4.2 23.0 55 >50  Elbow    8.2  8.1         Right Median Motor (Abd Poll Brev)  30.8C  Wrist    3.9 <4.2 7.8 >5 Elbow Wrist 4.7 24.5 52 >50  Elbow    8.6  4.3         Left Ulnar Motor (Abd Dig Min)  29.6C  Wrist    3.8 <4.2 6.5 >3 B Elbow Wrist 3.9 22.0 56 >53  B Elbow    7.7  6.8  A Elbow B Elbow 1.6 10.0 62 >53  A Elbow    9.3  5.3         Right Ulnar Motor (Abd Dig Min)  30.9C  Wrist    3.2 <4.2 9.9 >3 B Elbow Wrist 3.8 23.5 62 >53  B Elbow    7.0  9.5  A Elbow  B Elbow 1.4 10.0 71 >53  A Elbow    8.4  10.1          EMG   Side Muscle Nerve Root Ins Act Fibs Psw Amp Dur Poly Recrt Int Fraser Din Comment  Right Abd [employer]  Median C8-T1 Nml Nml Nml Nml Nml 0 Nml Nml   Right 1stDorInt Ulnar C8-T1 Nml Nml Nml Nml Nml 0 Nml Nml   Right PronatorTeres Median C6-7 Nml Nml Nml Nml Nml 0 Nml Nml   Right Biceps Musculocut C5-6 Nml Nml Nml Nml Nml 0 Nml Nml   Right Deltoid Axillary C5-6 Nml Nml Nml Nml Nml 0 Nml Nml     Nerve Conduction Studies Anti Sensory Left/Right Comparison   Stim Site L Lat (ms) R Lat (ms) L-R Lat (ms) L Amp (V) R Amp (V) L-R Amp (%) Site1 Site2 L Vel (m/s) R Vel (m/s) L-R Vel (m/s)  Median Acr Palm Anti Sensory (2nd Digit)  30C  Wrist *3.7 *3.8 0.1 23.4 22.6 3.4 Wrist Palm     Palm *2.1 1.9 0.2 22.2 19.2 13.5       Radial Anti Sensory (Base 1st Digit)  29.4C  Wrist 2.6 2.4 0.2 21.9 24.4 10.2 Wrist Base 1st Digit     Ulnar Anti Sensory (5th Digit)  29.8C  Wrist *3.8 3.7 0.1 22.9 20.3 11.4 Wrist 5th Digit *37 38 1   Motor Left/Right Comparison   Stim Site L Lat (ms) R Lat (ms) L-R Lat (ms) L Amp (mV) R Amp (mV) L-R Amp (%) Site1 Site2 L Vel (m/s) R Vel (m/s) L-R Vel (m/s)  Median Motor (Abd Poll Brev)  29.3C  Wrist 4.0 3.9 0.1 9.1 7.8 14.3 Elbow Wrist 55 52 3  Elbow 8.2 8.6 0.4 8.1 4.3 46.9       Ulnar Motor (Abd Dig Min)  29.6C  Wrist 3.8 3.2 0.6 6.5 9.9 *34.3 B Elbow Wrist 56 62 6  B Elbow 7.7 7.0 0.7 6.8 9.5 28.4 A Elbow B Elbow 62 71 9  A Elbow 9.3 8.4 0.9 5.3 10.1 47.5          Waveforms:                      Clinical History: No specialty comments available.   He reports that he has never smoked. He does not have any smokeless tobacco history on file.  Recent Labs    12/16/21 0107 04/30/22 0906 08/05/22 0854  HGBA1C >15.5* 6.6 6.8    Objective:  VS:  HT:    WT:   BMI:     BP:   HR: bpm  TEMP: ( )  RESP:  Physical Exam Vitals and nursing note reviewed.  Constitutional:      General: He is  not in acute distress.    Appearance: He is well-developed.  HENT:     Head: Normocephalic and atraumatic.     Nose: Nose normal.     Mouth/Throat:     Mouth: Mucous membranes are moist.     Pharynx: Oropharynx is clear.  Eyes:     Conjunctiva/sclera: Conjunctivae normal.     Pupils: Pupils are equal, round, and reactive to light.  Neck:     Trachea: No tracheal deviation.  Cardiovascular:     Rate and Rhythm: Normal rate and regular rhythm.     Pulses: Normal pulses.  Pulmonary:     Effort: Pulmonary effort is normal.     Breath sounds: Normal breath sounds.  Abdominal:     General: There is no distension.     Palpations: Abdomen is soft.     Tenderness: There is no guarding or rebound.  Musculoskeletal:        General: No tenderness or deformity.  Cervical back: Normal range of motion and neck supple.     Right lower leg: No edema.     Left lower leg: No edema.     Comments: Inspection reveals no atrophy of the bilateral APB or FDI or hand intrinsics. There is no swelling, color changes, allodynia or dystrophic changes. There is 5 out of 5 strength in the bilateral wrist extension, finger abduction and long finger flexion. There is intact sensation to light touch in all dermatomal and peripheral nerve distributions. There is a negative Tinel's test at the bilateral wrist and elbow. There is a negative Phalen's test bilaterally. There is a negative Hoffmann's test bilaterally.  Skin:    General: Skin is warm and dry.     Findings: No erythema or rash.  Neurological:     General: No focal deficit present.     Mental Status: He is alert and oriented to person, place, and time.     Sensory: No sensory deficit.     Motor: No weakness or abnormal muscle tone.     Coordination: Coordination normal.     Gait: Gait normal.  Psychiatric:        Mood and Affect: Mood normal.        Behavior: Behavior normal.        Thought Content: Thought content normal.     Ortho  Exam  Imaging: No results found.  Past Medical/Family/Surgical/Social History: Medications & Allergies reviewed per EMR, new medications updated. Patient Active Problem List   Diagnosis Date Noted   Uncontrolled type 2 diabetes mellitus with hyperglycemia, with long-term current use of insulin (HCC) 12/16/2021   Past Medical History:  Diagnosis Date   DM type 2, not at goal [facility])    No family history on file. Past Surgical History:  Procedure Laterality Date   EYE SURGERY     6 months old   HERNIA REPAIR     as child   Social History   Occupational History   Not on file  Tobacco Use   Smoking status: Never   Smokeless tobacco: Not on file  Vaping Use   Vaping Use: Never used  Substance and Sexual Activity   Alcohol use: Not Currently   Drug use: No   Sexual activity: Not on file

## 2022-09-15 ENCOUNTER — Telehealth: Payer: Self-pay | Admitting: Emergency Medicine

## 2022-09-15 ENCOUNTER — Telehealth: Payer: Self-pay | Admitting: Orthopaedic Surgery

## 2022-09-15 NOTE — Telephone Encounter (Signed)
Patient states he had an referral for his back and he has been waiting on someone to call him..please advise..[phone]

## 2022-09-15 NOTE — Telephone Encounter (Signed)
Copied from [place] [phone]. Topic: Referral - Question >> Sep 15, 2022  2:50 PM Penni Bombard wrote: Reason for CRM: pt called stating since Dr. Patrice Paradise does not accept medicaid who can he be referred to about his back  CB#  [phone]

## 2022-09-17 ENCOUNTER — Ambulatory Visit: Payer: Medicaid Other | Admitting: Physician Assistant

## 2022-09-17 ENCOUNTER — Encounter: Payer: Self-pay | Admitting: Orthopaedic Surgery

## 2022-09-17 DIAGNOSIS — G5601 Carpal tunnel syndrome, right upper limb: Secondary | ICD-10-CM | POA: Diagnosis not present

## 2022-09-17 DIAGNOSIS — G5602 Carpal tunnel syndrome, left upper limb: Secondary | ICD-10-CM | POA: Diagnosis not present

## 2022-09-17 DIAGNOSIS — G5603 Carpal tunnel syndrome, bilateral upper limbs: Secondary | ICD-10-CM

## 2022-09-17 MED ORDER — BUPIVACAINE HCL 0.25 % IJ SOLN
0.3300 mL | INTRAMUSCULAR | Status: AC | PRN
  Administered 2022-09-17: .33 mL

## 2022-09-17 MED ORDER — METHYLPREDNISOLONE ACETATE 40 MG/ML IJ SUSP
40.0000 mg | INTRAMUSCULAR | Status: AC | PRN
  Administered 2022-09-17: 40 mg

## 2022-09-17 MED ORDER — LIDOCAINE HCL 1 % IJ SOLN
1.0000 mL | INTRAMUSCULAR | Status: AC | PRN
  Administered 2022-09-17: 1 mL

## 2022-09-17 NOTE — Progress Notes (Signed)
   Office Visit Note   Patient: Brady Stephens           Date of Birth: 04/21/1976           MRN: [id number] Visit Date: 09/17/2022              Requested by: Gildardo Pounds, NP [place],  [place] [address] PCP: Gildardo Pounds, NP   Assessment & Plan: Visit Diagnoses:  1. Bilateral carpal tunnel syndrome     Plan: Impression is bilateral hand carpal tunnel syndrome mild in severity.  We have discussed various treatment options to include carpal tunnel injection.  He is interested in this today.  Because his hemoglobin A1c is relatively controlled, I am okay injecting both sides.  He will follow-up with [place] as needed.  Follow-Up Instructions: Return if symptoms worsen or fail to improve.   Orders:  No orders of the defined types were placed in this encounter.  No orders of the defined types were placed in this encounter.     Procedures: Hand/UE Inj: bilateral carpal tunnel for carpal tunnel syndrome on 09/17/2022 8:21 AM Indications: pain Details: 25 G needle, volar approach Medications (Right): 1 mL lidocaine 1 %; 40 mg methylPREDNISolone acetate 40 MG/ML; 0.33 mL bupivacaine 0.25 % Medications (Left): 40 mg methylPREDNISolone acetate 40 MG/ML; 1 mL lidocaine 1 %; 0.33 mL bupivacaine 0.25 %      Clinical Data: No additional findings.   Subjective: Chief Complaint  Patient presents with   Right Hand - Follow-up    EMG review   Left Hand - Follow-up    EMG review    HPI patient is a pleasant 47 year old gentleman who comes in today to discuss nerve conduction studies bilateral upper extremities.  He has been complaining of paresthesias to both hands throughout the median nerve distribution.  He was seen by Dr. Ernestina Patches where nerve conduction studies were performed.  Study suggest mild bilateral carpal tunnel syndrome.  Patient has not previously undergone carpal tunnel injections.  He is a diabetic with his last hemoglobin A1c of  6.8.  Review of Systems as detailed in HPI.  All others reviewed and are negative.   Objective: Vital Signs: There were no vitals taken for this visit.  Physical Exam well-developed well-nourished gentleman in no acute distress.  Alert and oriented x 3.  Ortho Exam unchanged bilateral hand exam  Specialty Comments:  No specialty comments available.  Imaging: No new imaging   PMFS History: Patient Active Problem List   Diagnosis Date Noted   Uncontrolled type 2 diabetes mellitus with hyperglycemia, with long-term current use of insulin ([place]) 12/16/2021   Past Medical History:  Diagnosis Date   DM type 2, not at goal [facility])     No family history on file.  Past Surgical History:  Procedure Laterality Date   EYE SURGERY     19 months old   HERNIA REPAIR     as child   Social History   Occupational History   Not on file  Tobacco Use   Smoking status: Never   Smokeless tobacco: Not on file  Vaping Use   Vaping Use: Never used  Substance and Sexual Activity   Alcohol use: Not Currently   Drug use: No   Sexual activity: Not on file

## 2022-10-01 ENCOUNTER — Other Ambulatory Visit: Payer: Self-pay

## 2022-10-02 ENCOUNTER — Other Ambulatory Visit: Payer: Self-pay

## 2022-10-02 DIAGNOSIS — Z419 Encounter for procedure for purposes other than remedying health state, unspecified: Secondary | ICD-10-CM | POA: Diagnosis not present

## 2022-10-31 ENCOUNTER — Encounter: Payer: Self-pay | Admitting: Orthopaedic Surgery

## 2022-10-31 ENCOUNTER — Ambulatory Visit (INDEPENDENT_AMBULATORY_CARE_PROVIDER_SITE_OTHER): Payer: Medicaid Other

## 2022-10-31 ENCOUNTER — Ambulatory Visit (INDEPENDENT_AMBULATORY_CARE_PROVIDER_SITE_OTHER): Payer: Medicaid Other | Admitting: Orthopaedic Surgery

## 2022-10-31 DIAGNOSIS — M79672 Pain in left foot: Secondary | ICD-10-CM

## 2022-10-31 DIAGNOSIS — M79671 Pain in right foot: Secondary | ICD-10-CM

## 2022-10-31 DIAGNOSIS — Z419 Encounter for procedure for purposes other than remedying health state, unspecified: Secondary | ICD-10-CM | POA: Diagnosis not present

## 2022-10-31 NOTE — Progress Notes (Signed)
Office Visit Note   Patient: Brady Stephens           Date of Birth: [birth year]           MRN: [id number] Visit Date: 10/31/2022              Requested by: Gildardo Pounds, NP [place],  [place] [address] PCP: Gildardo Pounds, NP   Assessment & Plan: Visit Diagnoses:  1. Bilateral foot pain     Plan: Impression is chronic bilateral foot pain to the metatarsal heads.  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  At this point, would like to make referral to rheumatology for further evaluation and treatment recommendation.  He will follow-up with [place] as needed.  Follow-Up Instructions: Return if symptoms worsen or fail to improve.   Orders:  Orders Placed This Encounter  Procedures   XR Foot Complete Left   XR Foot Complete Right   No orders of the defined types were placed in this encounter.     Procedures: No procedures performed   Clinical Data: No additional findings.   Subjective: Chief Complaint  Patient presents with   Left Foot - Pain   Right Foot - Pain    HPI patient is a pleasant 47 year old gentleman who comes in today with bilateral foot pain both equally as bad.  This is been ongoing for years but has progressively worsened.  The pain he has is to the ball of both feet.  This is described as a constant ache but worse with walking as well as at night when he is trying to sleep.  He feels as though he is walking on marbles or really just the bones in his feet.  He has tried inserts and compression socks which somewhat helped while he is wearing these but the pain returns when he takes his shoes off.  He has been taking meloxicam with minimal relief.  No history of trauma or any other injury to either foot.  Review of Systems as detailed in HPI.  All others reviewed and are negative.   Objective: Vital Signs: There were no vitals taken for this  visit.  Physical Exam well-developed well-nourished gentleman in no acute distress.  Alert and oriented x 3.  Ortho Exam bilateral foot exam reveals diffuse tenderness throughout all metatarsal heads.  He does have slight discomfort with flexion of his toes.  He is neurovascularly intact distally.  Specialty Comments:  No specialty comments available.  Imaging: XR Foot Complete Right  Result Date: 10/31/2022 X-rays demonstrate what appears to be somewhat of a bony erosion to the distal phalanx of the second third and fifth toes  XR Foot Complete Left  Result Date: 10/31/2022 X-rays demonstrate what appears to be somewhat of a bony erosion to the distal phalanx of the second third and fifth toes    PMFS History: Patient Active Problem List   Diagnosis Date Noted   Uncontrolled type 2 diabetes mellitus with hyperglycemia, with long-term current use of insulin ([facility]) 12/16/2021   Past Medical History:  Diagnosis Date   DM type 2, not at goal [facility])     No family history on file.  Past Surgical History:  Procedure Laterality Date   EYE SURGERY     39 months old   HERNIA REPAIR     as child   Social History   Occupational History   Not  on file  Tobacco Use   Smoking status: Never   Smokeless tobacco: Not on file  Vaping Use   Vaping Use: Never used  Substance and Sexual Activity   Alcohol use: Not Currently   Drug use: No   Sexual activity: Not on file

## 2022-11-02 ENCOUNTER — Encounter: Payer: Self-pay | Admitting: Orthopaedic Surgery

## 2022-11-12 ENCOUNTER — Encounter: Payer: Self-pay | Admitting: Nurse Practitioner

## 2022-11-12 NOTE — Telephone Encounter (Signed)
Alinda Sierras any ideas on who we could send a referral to? Please see below.

## 2022-11-26 NOTE — Telephone Encounter (Signed)
Brady Stephens can you put in another referral. I have no idea how to resolve his issue

## 2022-12-01 DIAGNOSIS — Z419 Encounter for procedure for purposes other than remedying health state, unspecified: Secondary | ICD-10-CM | POA: Diagnosis not present

## 2022-12-02 DIAGNOSIS — M48061 Spinal stenosis, lumbar region without neurogenic claudication: Secondary | ICD-10-CM | POA: Diagnosis not present

## 2022-12-02 DIAGNOSIS — M47816 Spondylosis without myelopathy or radiculopathy, lumbar region: Secondary | ICD-10-CM | POA: Diagnosis not present

## 2023-01-18 ENCOUNTER — Encounter: Payer: Self-pay | Admitting: Nurse Practitioner

## 2023-01-25 ENCOUNTER — Other Ambulatory Visit: Payer: Self-pay | Admitting: Nurse Practitioner

## 2023-01-25 ENCOUNTER — Other Ambulatory Visit: Payer: Self-pay | Admitting: Family Medicine

## 2023-01-25 DIAGNOSIS — E1165 Type 2 diabetes mellitus with hyperglycemia: Secondary | ICD-10-CM

## 2023-01-27 ENCOUNTER — Other Ambulatory Visit: Payer: Self-pay

## 2023-01-27 MED ORDER — LANTUS SOLOSTAR 100 UNIT/ML ~~LOC~~ SOPN
20.0000 [IU] | PEN_INJECTOR | Freq: Every day | SUBCUTANEOUS | 0 refills | Status: DC
  Filled 2023-01-27: qty 15, 75d supply, fill #0

## 2023-01-27 MED ORDER — TECHLITE PEN NEEDLES 31G X 8 MM MISC
1 refills | Status: DC
  Filled 2023-01-27: qty 100, 90d supply, fill #0
  Filled 2023-04-30: qty 100, 90d supply, fill #1

## 2023-01-27 NOTE — Telephone Encounter (Signed)
Requested Prescriptions  Pending Prescriptions Disp Refills   Insulin Pen Needle (TECHLITE PEN NEEDLES) 31G X 8 MM MISC 100 each 1    Sig: Use as instructed. Inject into the skin once daily     Endocrinology: Diabetes - Testing Supplies Passed - 01/25/2023 12:29 PM      Passed - Valid encounter within last 12 months    Recent Outpatient Visits           5 months ago Uncontrolled type 2 diabetes mellitus with hyperglycemia, with long-term current use of insulin [facility])   [facility] [place], [place] W, NP   9 months ago Uncontrolled type 2 diabetes mellitus with hyperglycemia, with long-term current use of insulin [facility])   [facility] [place], [place] W, NP   10 months ago Uncontrolled type 2 diabetes mellitus with hyperglycemia, with long-term current use of insulin [facility])   [place] [facility] [place], [place] L, RPH-CPP   1 year ago Uncontrolled type 2 diabetes mellitus with hyperglycemia, with long-term current use of insulin [facility])   [place] [facility] [place], [place] Stakes, NP       Future Appointments             In 2 weeks Claiborne Rigg, NP [facility]   In 3 months Pollyann Savoy, MD [facility]

## 2023-01-27 NOTE — Telephone Encounter (Signed)
Requested Prescriptions  Pending Prescriptions Disp Refills   insulin glargine (LANTUS SOLOSTAR) 100 UNIT/ML Solostar Pen 15 mL 0    Sig: Inject 20 Units into the skin daily.     Endocrinology:  Diabetes - Insulins Passed - 01/25/2023 12:27 PM      Passed - HBA1C is between 0 and 7.9 and within 180 days    HbA1c, POC (controlled diabetic range)  Date Value Ref Range Status  08/05/2022 6.8 0.0 - 7.0 % Final         Passed - Valid encounter within last 6 months    Recent Outpatient Visits           5 months ago Uncontrolled type 2 diabetes mellitus with hyperglycemia, with long-term current use of insulin [facility])   [place] [facility] [place], [place] W, NP   9 months ago Uncontrolled type 2 diabetes mellitus with hyperglycemia, with long-term current use of insulin [facility])   [place] [facility] [place], [place] W, NP   10 months ago Uncontrolled type 2 diabetes mellitus with hyperglycemia, with long-term current use of insulin [facility])   [place] [facility] [place], [place] L, RPH-CPP   1 year ago Uncontrolled type 2 diabetes mellitus with hyperglycemia, with long-term current use of insulin [facility])   [place] [facility] [place], Shea Stakes, NP       Future Appointments             In 2 weeks Claiborne Rigg, NP [facility]   In 3 months Pollyann Savoy, MD [facility]

## 2023-01-28 DIAGNOSIS — G894 Chronic pain syndrome: Secondary | ICD-10-CM | POA: Diagnosis not present

## 2023-01-28 DIAGNOSIS — Z01818 Encounter for other preprocedural examination: Secondary | ICD-10-CM | POA: Diagnosis not present

## 2023-01-30 ENCOUNTER — Other Ambulatory Visit: Payer: Self-pay

## 2023-02-07 ENCOUNTER — Encounter (HOSPITAL_COMMUNITY): Payer: Self-pay

## 2023-02-07 ENCOUNTER — Ambulatory Visit (HOSPITAL_COMMUNITY)
Admission: EM | Admit: 2023-02-07 | Discharge: 2023-02-07 | Disposition: A | Discharge status: Home / Self Care | Payer: Medicaid Other | Attending: Emergency Medicine | Admitting: Emergency Medicine

## 2023-02-07 DIAGNOSIS — M151 Heberden's nodes (with arthropathy): Secondary | ICD-10-CM

## 2023-02-07 DIAGNOSIS — M25512 Pain in left shoulder: Secondary | ICD-10-CM

## 2023-02-07 DIAGNOSIS — M25562 Pain in left knee: Secondary | ICD-10-CM | POA: Diagnosis not present

## 2023-02-07 DIAGNOSIS — G8929 Other chronic pain: Secondary | ICD-10-CM

## 2023-02-07 DIAGNOSIS — M25561 Pain in right knee: Secondary | ICD-10-CM | POA: Diagnosis not present

## 2023-02-07 DIAGNOSIS — R0789 Other chest pain: Secondary | ICD-10-CM

## 2023-02-07 MED ORDER — CYCLOBENZAPRINE HCL 10 MG PO TABS: 10.0000 mg | ORAL_TABLET | Freq: Every day | ORAL | 0 refills | Status: DC

## 2023-02-07 MED ORDER — KETOROLAC TROMETHAMINE 30 MG/ML IJ SOLN
INTRAMUSCULAR | Status: AC
  Filled 2023-02-07: qty 1

## 2023-02-07 MED ORDER — KETOROLAC TROMETHAMINE 30 MG/ML IJ SOLN
30.0000 mg | Freq: Once | INTRAMUSCULAR | Status: AC
  Administered 2023-02-07: 30 mg via INTRAMUSCULAR

## 2023-02-07 MED ORDER — MELOXICAM 15 MG PO TABS: 15.0000 mg | ORAL_TABLET | Freq: Every day | ORAL | 0 refills | Status: DC

## 2023-02-07 NOTE — Discharge Instructions (Signed)
Your pain is most likely caused by irritation to the muscles, very low suspicion that any involvement is broken and therefore we have held off on imaging  You have been given an injection of Toradol here today in the office to reduce inflammation, ideally will start to see something relief in about 30 minutes to an hour  Starting tomorrow take meloxicam every morning with food for 5 days  May continue use of Robaxin as needed for comfort, Flexeril is also been sent to the pharmacy as an alternative if Robaxin is ineffective  Will hold off on use of steroids for management due to your diabetes  You may use heating pad in 15 minute intervals as needed for additional comfort, within the first 2-3 days you may find comfort in using ice in 10-15 minutes over affected area  Begin stretching affected area daily for 10 minutes as tolerated to further loosen muscles   When lying down place pillow underneath knees, behind back and underneath the arm  Can try sleeping without pillow on firm mattress this helps to keep the neck and back in alignment which can sometimes help with pain  Practice good posture: head back, shoulders back, chest forward, pelvis back and weight distributed evenly on both legs  If pain persist after recommended treatment or reoccurs if may be beneficial to follow up with orthopedic specialist for evaluation, this doctor specializes in the bones and can manage your symptoms long-term with options such as but not limited to imaging, medications or physical therapy
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
## 2023-02-13 ENCOUNTER — Ambulatory Visit: Payer: Medicaid Other | Admitting: Nurse Practitioner
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
## 2023-04-10 IMAGING — CT CT BIOPSY
3 of 4 series · 14 of 32 positions shown, 19 images · non-contrast
Comparison: none

CLINICAL DATA: Chronic low back and sacroiliac region pain

[Series 2: needle -guided injection · axial · 0.73mm/px · z∈[-138,-92]mm · 6 of 33 slices shown, 11 images (1 of 3)]
[im 5/33  soft-tissue]
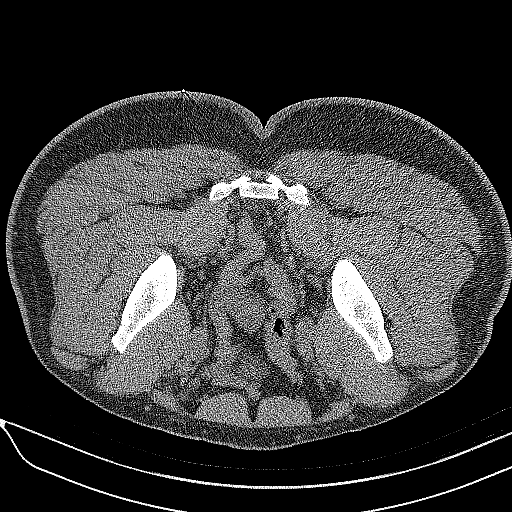
[im 5/33  bone]
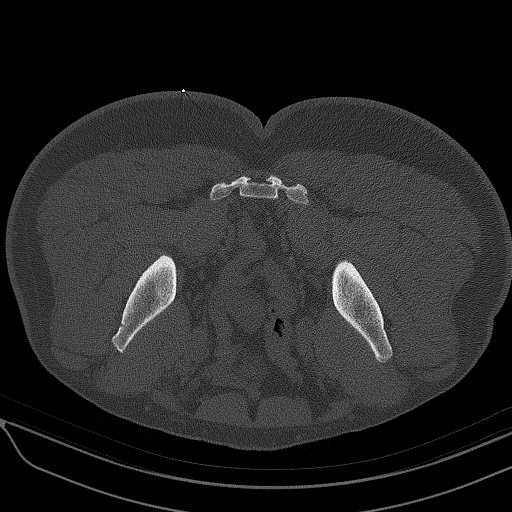
[im 10/33  soft-tissue]
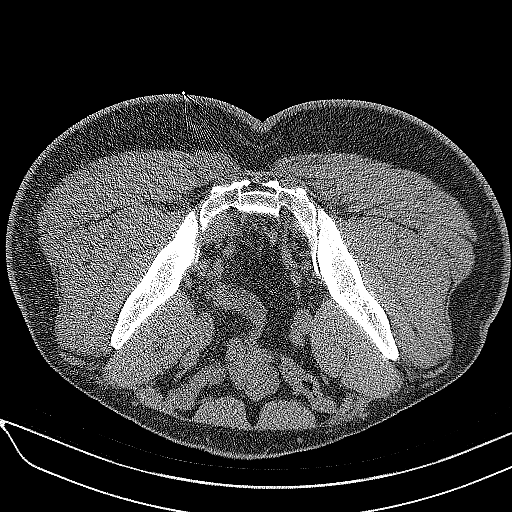
[im 14/33  soft-tissue]
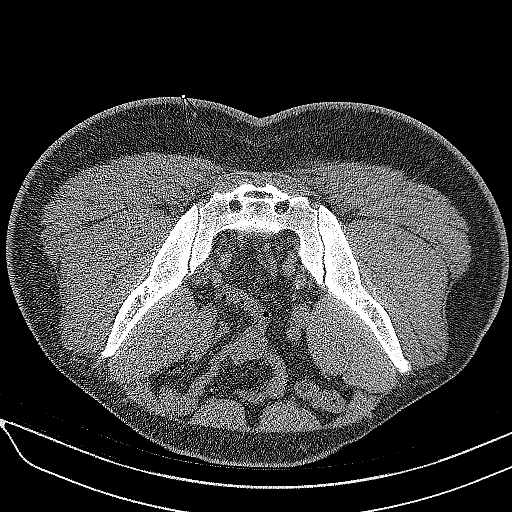
[im 14/33  lung]
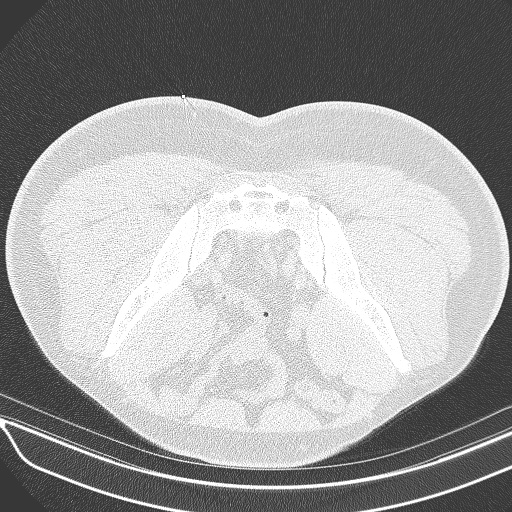
[im 19/33  soft-tissue]
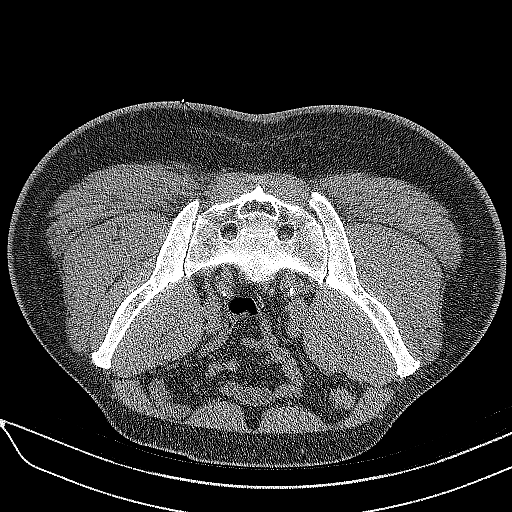
[im 19/33  lung]
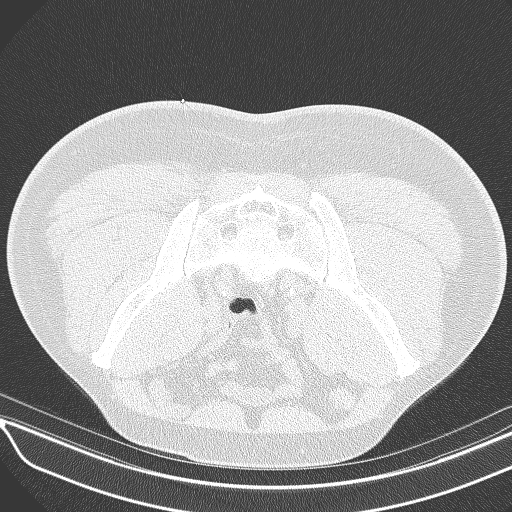
[im 23/33  soft-tissue]
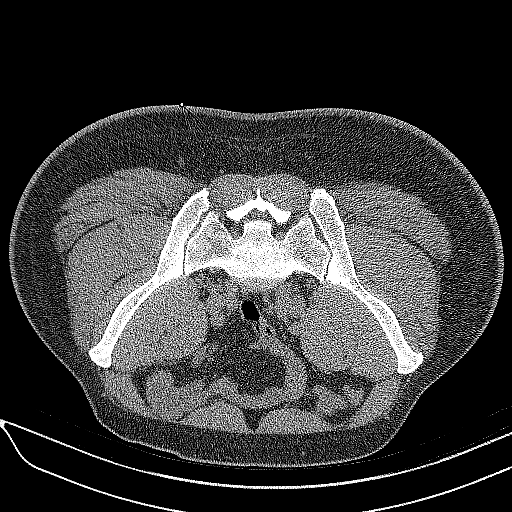
[im 23/33  lung]
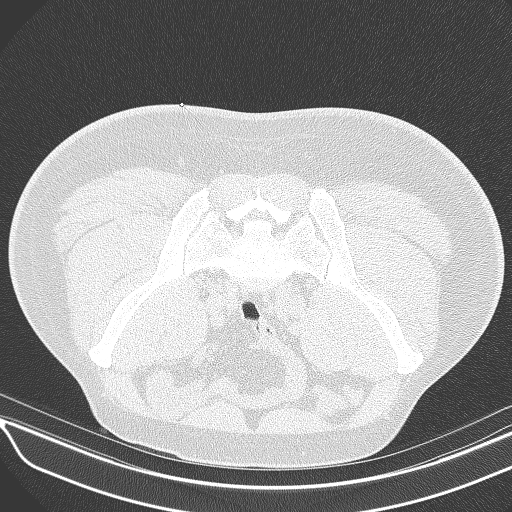
[im 28/33  soft-tissue]
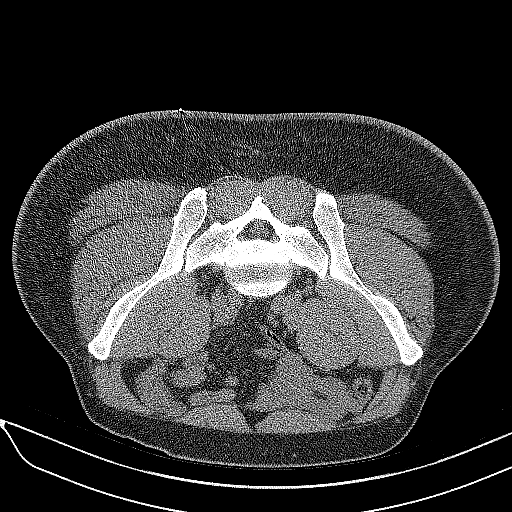
[im 28/33  lung]
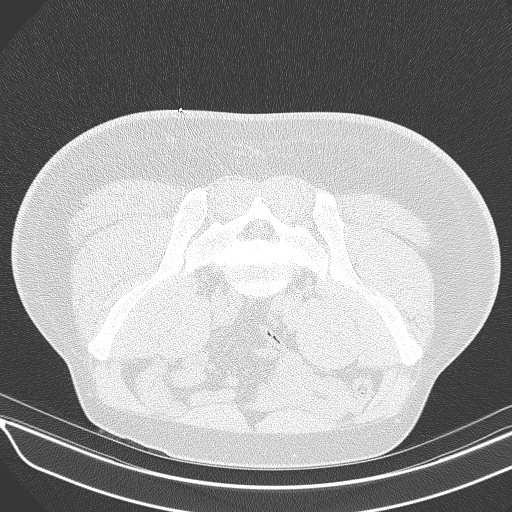

[Series 3: needle -guided injection · axial · 0.73mm/px · z∈[-138,-92]mm · 6 of 33 slices shown (2 of 3)]
[im 5/33  soft-tissue]
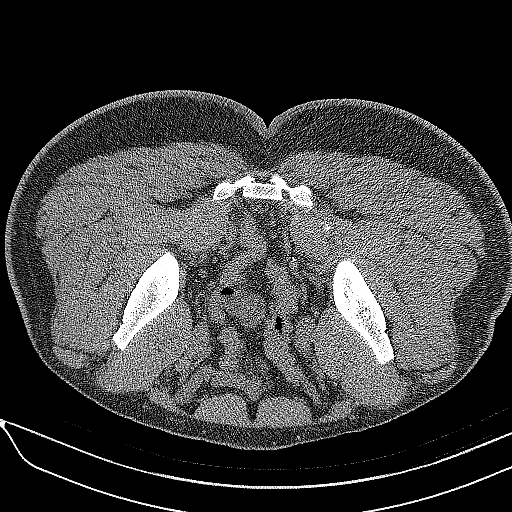
[im 10/33  soft-tissue]
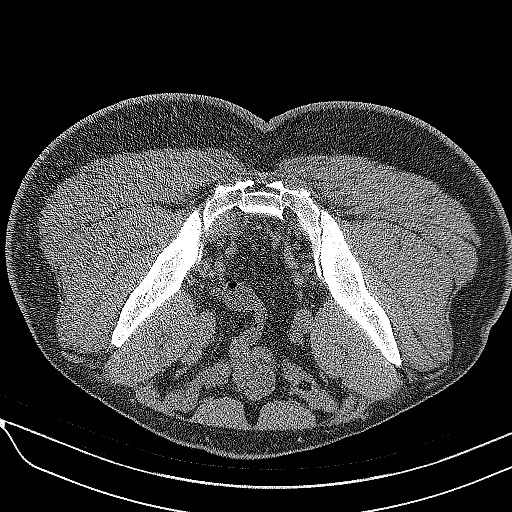
[im 14/33  soft-tissue]
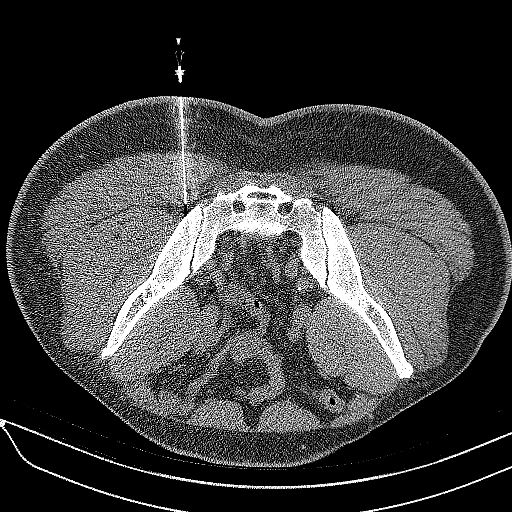
[im 19/33  soft-tissue]
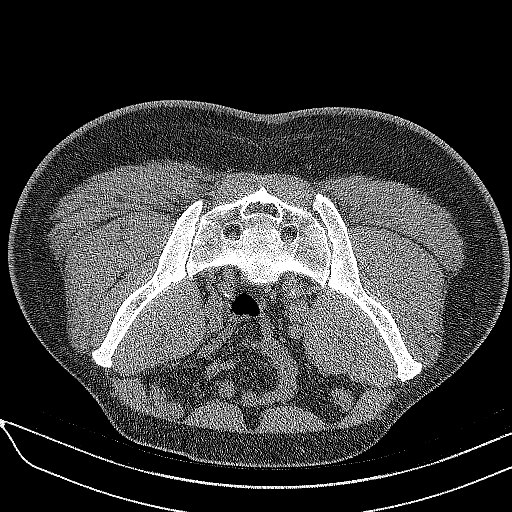
[im 23/33  soft-tissue]
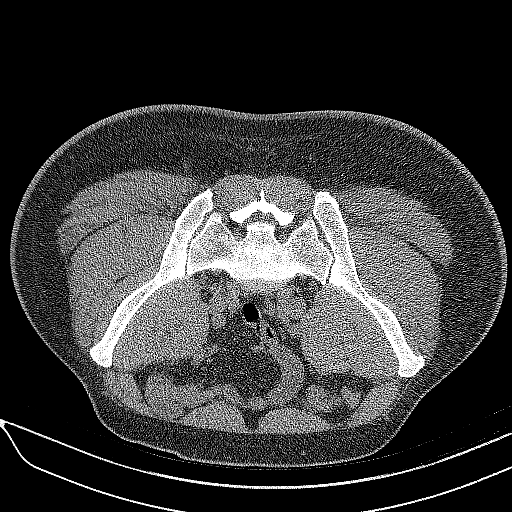
[im 28/33  soft-tissue]
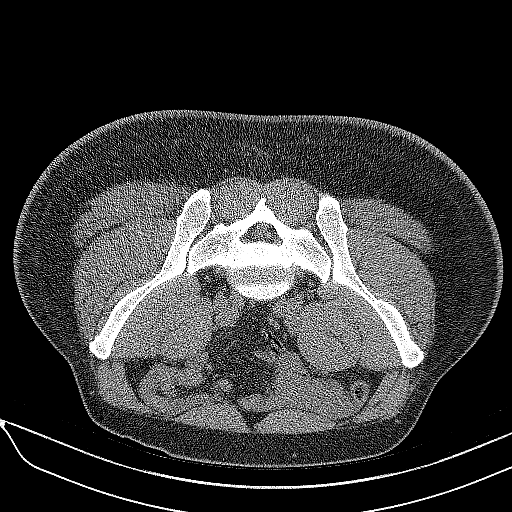

[Series 4: needle -guided injection · axial · 0.73mm/px · z∈[-138,-128]mm · 2 of 33 slices shown (3 of 3)]
[im 5/33  soft-tissue]
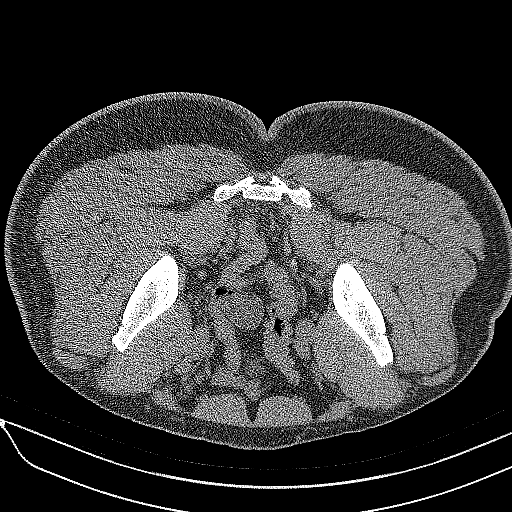
[im 10/33  soft-tissue]
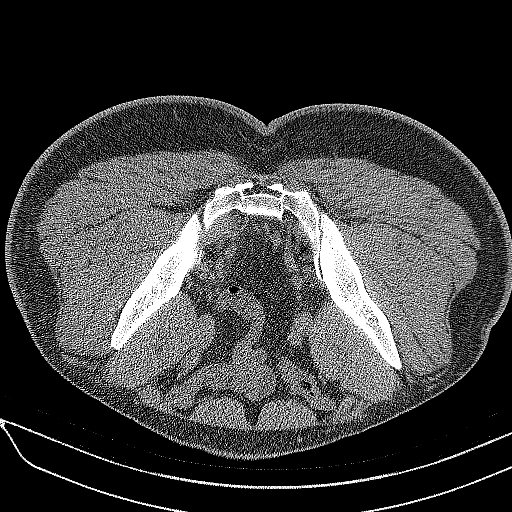

[14 of 32 positions shown; findings below may reference images not displayed]

EXAM:
Right CT GUIDED SI JOINT INJECTION



After local anesthesia with 1% lidocaine without epinephrine and
subsequent deep anesthesia, a 22 gauge spinal needle was advanced
into the right SI joint under intermittent CT guidance.

Once the needle was in satisfactory position, representative image
was captured with the needle demonstrated in the sacroiliac joint.
Subsequently, 80 mg Depo-Medrol mixed with 0.5 cc 0.5% bupivacaine
was injected into the right SI joint. Needles removed and a sterile
dressing applied.

No complications were observed.
IMPRESSION: Successful CT-guided right SI joint injection with steroid and
anesthetic. During injection, the patient felt concordant symptoms.

## 2023-04-13 ENCOUNTER — Other Ambulatory Visit: Payer: Self-pay | Admitting: Nurse Practitioner

## 2023-04-14 ENCOUNTER — Other Ambulatory Visit: Payer: Self-pay

## 2023-04-14 ENCOUNTER — Ambulatory Visit: Payer: Medicaid Other | Attending: Nurse Practitioner | Admitting: Nurse Practitioner

## 2023-04-14 VITALS — BP 104/67 | HR 91 | Ht 68.0 in | Wt 210.4 lb

## 2023-04-14 DIAGNOSIS — E785 Hyperlipidemia, unspecified: Secondary | ICD-10-CM | POA: Diagnosis not present

## 2023-04-14 DIAGNOSIS — Z1211 Encounter for screening for malignant neoplasm of colon: Secondary | ICD-10-CM

## 2023-04-14 DIAGNOSIS — E1165 Type 2 diabetes mellitus with hyperglycemia: Secondary | ICD-10-CM | POA: Diagnosis not present

## 2023-04-14 DIAGNOSIS — D582 Other hemoglobinopathies: Secondary | ICD-10-CM

## 2023-04-14 DIAGNOSIS — Z794 Long term (current) use of insulin: Secondary | ICD-10-CM

## 2023-04-14 LAB — POCT GLYCOSYLATED HEMOGLOBIN (HGB A1C): Hemoglobin A1C: 6.8 % — AB (ref 4.0–5.6)

## 2023-04-14 MED ORDER — LANTUS SOLOSTAR 100 UNIT/ML ~~LOC~~ SOPN
20.0000 [IU] | PEN_INJECTOR | Freq: Every day | SUBCUTANEOUS | 1 refills | Status: DC
Start: 2023-04-14 — End: 2023-09-14
  Filled 2023-04-14: qty 15, 75d supply, fill #0
  Filled 2023-07-07: qty 15, 75d supply, fill #1

## 2023-04-14 MED ORDER — PRAVASTATIN SODIUM 20 MG PO TABS
20.0000 mg | ORAL_TABLET | Freq: Every day | ORAL | 3 refills | Status: DC
Start: 2023-04-14 — End: 2023-09-22
  Filled 2023-04-14: qty 90, 90d supply, fill #0

## 2023-04-14 NOTE — Progress Notes (Signed)
Assessment & Plan:  Brady Stephens was seen today for medical management of chronic issues.  Diagnoses and all orders for this visit:  Uncontrolled type 2 diabetes mellitus with hyperglycemia, with long-term current use of insulin (HCC) -     POCT glycosylated hemoglobin (Hb A1C) -     insulin glargine (LANTUS SOLOSTAR) 100 UNIT/ML Solostar Pen; Inject 20 Units into the skin daily. -     CMP14+EGFR -     Urine Albumin/Creatinine with ratio (send out) [LAB689]  Colon cancer screening -     Fecal occult blood, imunochemical  Elevated hemoglobin (HCC) -     CBC with Differential  Dyslipidemia, goal LDL below 70 -     pravastatin (PRAVACHOL) 20 MG tablet; Take 1 tablet (20 mg total) by mouth daily. ASCVD RISK SCORE 13%   Patient has been counseled on age-appropriate routine health concerns for screening and prevention. These are reviewed and up-to-date. Referrals have been placed accordingly. Immunizations are up-to-date or declined.    Subjective:   Chief Complaint  Patient presents with   Medical Management of Chronic Issues   [name] 47 y.o. male presents to office today for follow up to DM   He has complaints of multiple joint arthralgias. ANA/RA panel were negative Currently being followed by pain medicine. Failed Spinal cord stimulator trial.  He does not want to take NSAIDs long term or anything that will affect his kidney or liver.  States he was hurt on the job over 1 year ago after a locker was knocked over and hit him in his shoulders, back, hips and legs. States his back has gone out several times. Feels pressure build up in his leg when he is sitting down and he has to stick his leg out to relieve the pressure or stand.   DM 2 DM is well controlled.  He is administering Lantus 20 units daily as prescribed. Lab Results  Component Value Date   HGBA1C 6.8 (A) 04/14/2023    Lab Results  Component Value Date   HGBA1C 6.8 08/05/2022  LDL not at goal.  Pravastatin  sent to pharmacy for cholesterol lowering. Lab Results  Component Value Date   LDLCALC 211 (H) 08/05/2022   The 10-year ASCVD risk score (Arnett DK, et al., 2019) is: 13.6%   Values used to calculate the score:     Age: 20 years     Sex: Male     Is Non-Hispanic African American: Yes     Diabetic: Yes     Tobacco smoker: Yes     Systolic Blood Pressure: 104 mmHg     Is BP treated: No     HDL Cholesterol: 33 mg/dL     Total Cholesterol: 266 mg/dL     Review of Systems  Constitutional:  Negative for fever, malaise/fatigue and weight loss.  HENT: Negative.  Negative for nosebleeds.   Eyes: Negative.  Negative for blurred vision, double vision and photophobia.  Respiratory: Negative.  Negative for cough and shortness of breath.   Cardiovascular: Negative.  Negative for chest pain, palpitations and leg swelling.  Gastrointestinal: Negative.  Negative for heartburn, nausea and vomiting.  Musculoskeletal:  Positive for back pain. Negative for myalgias.  Neurological:  Positive for sensory change. Negative for dizziness, focal weakness, seizures and headaches.  Psychiatric/Behavioral: Negative.  Negative for suicidal ideas.     Past Medical History:  Diagnosis Date   DM type 2, not at goal [facility])     Past  Surgical History:  Procedure Laterality Date   EYE SURGERY     6 months old   HERNIA REPAIR     as child    History reviewed. No pertinent family history.  Social History Reviewed with no changes to be made today.   Outpatient Medications Prior to Visit  Medication Sig Dispense Refill   Accu-Chek Softclix Lancets lancets Check blood glucose level by fingerstick two times per day. 100 each 2   Blood Glucose Monitoring Suppl (ACCU-CHEK GUIDE) w/Device KIT Check blood glucose level by fingerstick two times per day. 1 kit 0   glucose blood (ACCU-CHEK GUIDE) test strip Check blood glucose level by fingerstick two times per day. 100 each 12   Insulin Pen Needle (TECHLITE PEN  NEEDLES) 31G X 8 MM MISC Use as instructed. Inject into the skin once daily 100 each 1   meloxicam (MOBIC) 15 MG tablet Take 1 tablet (15 mg total) by mouth daily for back pain 30 tablet 0   methocarbamol (ROBAXIN) 750 MG tablet Take 1 tablet (750 mg total) by mouth 3 (three) times daily. For muscle spasms 90 tablet 3   Naphazoline-Pheniramine (VISINE-A OP) Place 1 drop into both eyes 2 (two) times daily as needed (dry itchy eyes).     insulin glargine (LANTUS SOLOSTAR) 100 UNIT/ML Solostar Pen Inject 20 Units into the skin daily. 15 mL 0   cyclobenzaprine (FLEXERIL) 10 MG tablet Take 1 tablet (10 mg total) by mouth at bedtime. (Patient not taking: Reported on 04/14/2023) 10 tablet 0   pravastatin (PRAVACHOL) 20 MG tablet Take 1 tablet (20 mg total) by mouth daily. (Patient not taking: Reported on 04/14/2023) 30 tablet 3   No facility-administered medications prior to visit.    No Known Allergies     Objective:    BP 104/67 (BP Location: Left Arm, Patient Position: Sitting, Cuff Size: Normal)   Pulse 91   Ht 5\' 8"  (1.727 m)   Wt 210 lb 6.4 oz (95.4 kg)   SpO2 97%   BMI 31.99 kg/m  Wt Readings from Last 3 Encounters:  04/14/23 210 lb 6.4 oz (95.4 kg)  02/07/23 205 lb (93 kg)  08/05/22 210 lb 14.4 oz (95.7 kg)    Physical Exam Vitals and nursing note reviewed.  Constitutional:      Appearance: He is well-developed.  HENT:     Head: Normocephalic and atraumatic.  Cardiovascular:     Rate and Rhythm: Normal rate and regular rhythm.     Heart sounds: Normal heart sounds. No murmur heard.    No friction rub. No gallop.  Pulmonary:     Effort: Pulmonary effort is normal. No tachypnea or respiratory distress.     Breath sounds: Normal breath sounds. No decreased breath sounds, wheezing, rhonchi or rales.  Chest:     Chest wall: No tenderness.  Abdominal:     General: Bowel sounds are normal.     Palpations: Abdomen is soft.  Musculoskeletal:        General: Normal range of  motion.     Cervical back: Normal range of motion.  Skin:    General: Skin is warm and dry.  Neurological:     Mental Status: He is alert and oriented to person, place, and time.     Coordination: Coordination normal.  Psychiatric:        Behavior: Behavior normal. Behavior is cooperative.        Thought Content: Thought content normal.  Judgment: Judgment normal.          Patient has been counseled extensively about nutrition and exercise as well as the importance of adherence with medications and regular follow-up. The patient was given clear instructions to go to ER or return to medical center if symptoms don't improve, worsen or new problems develop. The patient verbalized understanding.   Follow-up: Return in about 3 months (around 07/15/2023).   Claiborne Rigg, FNP-BC [facility] [place], [place] [phone]   04/14/2023, 4:17 PM

## 2023-04-15 ENCOUNTER — Encounter: Payer: Self-pay | Admitting: Rheumatology

## 2023-04-15 ENCOUNTER — Ambulatory Visit (INDEPENDENT_AMBULATORY_CARE_PROVIDER_SITE_OTHER): Payer: Medicaid Other

## 2023-04-15 ENCOUNTER — Other Ambulatory Visit: Payer: Self-pay

## 2023-04-15 ENCOUNTER — Ambulatory Visit: Payer: Medicaid Other | Attending: Rheumatology | Admitting: Rheumatology

## 2023-04-15 ENCOUNTER — Ambulatory Visit: Payer: Medicaid Other

## 2023-04-15 VITALS — BP 131/77 | HR 90 | Resp 17 | Ht 68.0 in | Wt 210.8 lb

## 2023-04-15 DIAGNOSIS — M5441 Lumbago with sciatica, right side: Secondary | ICD-10-CM

## 2023-04-15 DIAGNOSIS — G8929 Other chronic pain: Secondary | ICD-10-CM

## 2023-04-15 DIAGNOSIS — M79671 Pain in right foot: Secondary | ICD-10-CM

## 2023-04-15 DIAGNOSIS — M533 Sacrococcygeal disorders, not elsewhere classified: Secondary | ICD-10-CM

## 2023-04-15 DIAGNOSIS — M79672 Pain in left foot: Secondary | ICD-10-CM

## 2023-04-15 DIAGNOSIS — Z79899 Other long term (current) drug therapy: Secondary | ICD-10-CM

## 2023-04-15 DIAGNOSIS — M79641 Pain in right hand: Secondary | ICD-10-CM

## 2023-04-15 DIAGNOSIS — M79642 Pain in left hand: Secondary | ICD-10-CM

## 2023-04-15 DIAGNOSIS — E1165 Type 2 diabetes mellitus with hyperglycemia: Secondary | ICD-10-CM

## 2023-04-15 DIAGNOSIS — M5442 Lumbago with sciatica, left side: Secondary | ICD-10-CM | POA: Diagnosis not present

## 2023-04-15 DIAGNOSIS — Z794 Long term (current) use of insulin: Secondary | ICD-10-CM

## 2023-04-15 DIAGNOSIS — R5383 Other fatigue: Secondary | ICD-10-CM

## 2023-04-15 DIAGNOSIS — L409 Psoriasis, unspecified: Secondary | ICD-10-CM | POA: Diagnosis not present

## 2023-04-15 DIAGNOSIS — L4050 Arthropathic psoriasis, unspecified: Secondary | ICD-10-CM | POA: Diagnosis not present

## 2023-04-15 DIAGNOSIS — E782 Mixed hyperlipidemia: Secondary | ICD-10-CM

## 2023-04-15 NOTE — Patient Instructions (Signed)
Psoriatic Arthritis Psoriatic arthritis, or PsA, is a long-term (chronic) condition that causes pain, swelling, and stiffness in the joints. The large joints of the legs, hips, and pelvis are most often affected but it can affect any joint in your body. Most people with PsA have a chronic skin disease that causes itchy scales and patches to form on the skin (psoriasis) before they develop PsA. In some cases, a person may have PsA before or without having psoriasis. PsA can be mild or severe. If untreated, PsA may cause joint damage. What are the causes? The exact cause of this condition is not known. PsA is an autoimmune disease. With this type of disease, the body's defense system (immune system) mistakenly attacks joints and the tissues that connect muscles to joints (tendons). The disease may be activated by a trigger, such as: Infections. Stress. Alcohol. What increases the risk? You are more likely to develop this condition if: You have psoriasis. You have a family history of psoriasis or PsA. You are between the ages of 49 and 52. What are the signs or symptoms? The main symptom of this condition is inflammation of joints and tendons. Other symptoms may include: Joint pain or swelling. Joint stiffness, especially in the morning. Swollen fingers and toes. Pain in areas where tendons connect to bones. Pitted and weak nails. Tiredness (fatigue). Eye redness. Symptoms of this condition vary from person to person. Symptoms may come and go. Sometimes, symptoms get worse for a period of time. This is called a flare. How is this diagnosed? This condition may be diagnosed based on: Your symptoms and medical history. A physical exam. Imaging tests such as an X-ray, a CT scan, or an MRI. Blood tests to look for inflammation and to rule out other causes, such as gout or rheumatoid arthritis. You may also be referred to a health care provider who specializes in treating this condition  (rheumatologist). How is this treated? The goal of treatment is to reduce pain and inflammation and to protect joints from damage. Treatment depends on how severe the inflammation is and how many joints are affected. Treatment may include: Medicines. NSAIDs, such as ibuprofen or naproxen. Steroids. These can be taken by mouth or injected into a swollen joint. Disease-modifying anti-rheumatic drugs (DMARDs). These slow the course of the disease. Biologic medicines. These medicines are usually given as injections or through an IV. They can be very effective, but these medicines can increase the risk of developing infections. Physical therapy and other exercises to: Strengthen muscles that support joints. Prevent joint stiffness. Lifestyle changes. It is important to rest as needed, eat a healthy diet, and exercise. A brace or splint to support a painful and swollen joint. Surgery if you have severe joint damage. Management of any associated medical conditions. Inflammation from PsA can affect other parts of the body, including the heart, eyes, or kidneys. Follow these instructions at home: Managing pain, stiffness, and swelling  If directed, put ice on painful areas. To do this: If you have a removable brace or splint, remove it as told by your health care provider. Put ice in a plastic bag. Place a towel between your skin and the bag. Leave the ice on for 20 minutes, 2-3 times a day. Remove the ice if your skin turns bright red. This is very important. If you cannot feel pain, heat, or cold, you have a greater risk of damage to the area. If you have a removable brace or splint: Wear the brace or  splint as told by your health care provider. Remove it only as told by your health care provider. Check the skin around the brace or splint every day. Tell your health care provider about any concerns. Loosen the brace or splint if your fingers or toes tingle, become numb, or turn cold and  blue. Keep the brace or splint clean and dry. Activity Return to your normal activities as told by your health care provider. Ask your health care provider what activities are safe for you. Get regular exercise. Ask your health care provider what type of exercise is best for you. Your health care provider may recommend: Low-impact exercises such as walking, biking, or swimming. Exercises that include stretching, such as tai chi and yoga. Do not exercise when you have a flare of symptoms. Rest until the symptoms improve. Lifestyle  Maintain a healthy weight. A healthy weight will help you stay active and take stress off your joints. Eat a healthy diet that includes plenty of vegetables, fruits, whole grains, low-fat dairy products, and lean protein. Do not eat a lot of foods that are high in solid fats, added sugars, or salt. Use techniques for stress reduction, such as meditation or yoga. Do not use any products that contain nicotine or tobacco. These products include cigarettes, chewing tobacco, and vaping devices, such as e-cigarettes. If you need help quitting, ask your health care provider. Consider joining a PsA support group. General instructions Take over-the-counter and prescription medicines only as told by your health care provider. Keep a journal to help track your symptoms. Try to avoid any triggers. Do not drink alcohol if your health care provider tells you not to drink. See a mental health therapist if you feel sad, anxious, frustrated, and hopeless. Managing this condition can be challenging and you may feel overwhelmed and depressed. Keep all follow-up visits. Your health care provider may monitor your condition over time to make sure that it does not cause problems or get worse. Where to find support National Psoriasis Foundation: www.psoriasis.org Where to find more information American Academy of Dermatology: InfoExam.si [employer]:  www.rheumatology.org Contact a health care provider if: You have a fever or chills. Your symptoms get worse. You feel depressed, frustrated, and hopeless about your condition. Get help right away if: You have thoughts of hurting yourself or others. Get help right away if you feel like you may hurt yourself or others, or have thoughts about taking your own life. Go to your nearest emergency room or: Call 911. Call the National Suicide Prevention Lifeline at 913-398-1878 or 988. This is open 24 hours a day. Text the Crisis Text Line at 548-496-4978. Summary Psoriatic arthritis, or PsA, is a long-term condition that causes pain, swelling, and stiffness in the joints. The goal of treatment is to reduce pain and inflammation and to protect joints from damage. Treatment depends on how severe the inflammation is and how many joints are affected. This information is not intended to replace advice given to you by your health care provider. Make sure you discuss any questions you have with your health care provider. Document Revised: 10/07/2021 Document Reviewed: 10/07/2021 Elsevier Patient Education  2024 [employer].

## 2023-04-18 LAB — CBC WITH DIFFERENTIAL/PLATELET
Absolute Monocytes: 313 {cells}/uL (ref 200–950)
Basophils Absolute: 58 {cells}/uL (ref 0–200)
Basophils Relative: 1 %
Eosinophils Absolute: 58 {cells}/uL (ref 15–500)
Eosinophils Relative: 1 %
HCT: 50.2 % — ABNORMAL HIGH (ref 38.5–50.0)
Hemoglobin: 17 g/dL (ref 13.2–17.1)
Lymphs Abs: 2442 {cells}/uL (ref 850–3900)
MCH: 30.7 pg (ref 27.0–33.0)
MCHC: 33.9 g/dL (ref 32.0–36.0)
MCV: 90.8 fL (ref 80.0–100.0)
MPV: 9.6 fL (ref 7.5–12.5)
Monocytes Relative: 5.4 %
Neutro Abs: 2929 {cells}/uL (ref 1500–7800)
Neutrophils Relative %: 50.5 %
Platelets: 294 10*3/uL (ref 140–400)
RBC: 5.53 10*6/uL (ref 4.20–5.80)
RDW: 13.2 % (ref 11.0–15.0)
Total Lymphocyte: 42.1 %
WBC: 5.8 10*3/uL (ref 3.8–10.8)

## 2023-04-18 LAB — PROTEIN ELECTROPHORESIS, SERUM, WITH REFLEX
Albumin ELP: 4.4 g/dL (ref 3.8–4.8)
Alpha 1: 0.3 g/dL (ref 0.2–0.3)
Alpha 2: 0.6 g/dL (ref 0.5–0.9)
Beta 2: 0.5 g/dL (ref 0.2–0.5)
Beta Globulin: 0.5 g/dL (ref 0.4–0.6)
Gamma Globulin: 1.3 g/dL (ref 0.8–1.7)
Total Protein: 7.6 g/dL (ref 6.1–8.1)

## 2023-04-18 LAB — COMPLETE METABOLIC PANEL WITH GFR
AG Ratio: 1.4 (calc) (ref 1.0–2.5)
ALT: 15 U/L (ref 9–46)
AST: 13 U/L (ref 10–40)
Albumin: 4.4 g/dL (ref 3.6–5.1)
Alkaline phosphatase (APISO): 76 U/L (ref 36–130)
BUN: 14 mg/dL (ref 7–25)
CO2: 26 mmol/L (ref 20–32)
Calcium: 10 mg/dL (ref 8.6–10.3)
Chloride: 101 mmol/L (ref 98–110)
Creat: 1.07 mg/dL (ref 0.60–1.29)
Globulin: 3.2 g/dL (ref 1.9–3.7)
Glucose, Bld: 125 mg/dL — ABNORMAL HIGH (ref 65–99)
Potassium: 4.7 mmol/L (ref 3.5–5.3)
Sodium: 138 mmol/L (ref 135–146)
Total Bilirubin: 0.3 mg/dL (ref 0.2–1.2)
Total Protein: 7.6 g/dL (ref 6.1–8.1)
eGFR: 85 mL/min/{1.73_m2} (ref >=60)

## 2023-04-18 LAB — QUANTIFERON-TB GOLD PLUS
Mitogen-NIL: >10 [IU]/mL
NIL: 0.07 [IU]/mL
QuantiFERON-TB Gold Plus: NEGATIVE
TB1-NIL: <0 [IU]/mL
TB2-NIL: <0 [IU]/mL

## 2023-04-18 LAB — HEPATITIS C ANTIBODY: Hepatitis C Ab: NONREACTIVE

## 2023-04-18 LAB — URIC ACID: Uric Acid, Serum: 6.3 mg/dL (ref 4.0–8.0)

## 2023-04-18 LAB — CK: Total CK: 225 U/L — ABNORMAL HIGH (ref 44–196)

## 2023-04-18 LAB — HEPATITIS B CORE ANTIBODY, IGM: Hep B C IgM: NONREACTIVE

## 2023-04-18 LAB — CYCLIC CITRUL PEPTIDE ANTIBODY, IGG: Cyclic Citrullin Peptide Ab: <16 U

## 2023-04-18 LAB — IGG, IGA, IGM
IgG (Immunoglobin G), Serum: 1523 mg/dL (ref 600–1640)
IgM, Serum: 63 mg/dL (ref 50–300)
Immunoglobulin A: 264 mg/dL (ref 47–310)

## 2023-04-18 LAB — HEPATITIS B SURFACE ANTIGEN: Hepatitis B Surface Ag: NONREACTIVE

## 2023-04-18 LAB — SEDIMENTATION RATE: Sed Rate: 14 mm/h (ref 0–15)

## 2023-04-18 LAB — RHEUMATOID FACTOR: Rheumatoid fact SerPl-aCnc: <10 [IU]/mL (ref <14)

## 2023-04-18 NOTE — Progress Notes (Signed)
We will discuss results at the follow-up visit.

## 2023-04-21 ENCOUNTER — Ambulatory Visit: Payer: Medicaid Other | Admitting: Nurse Practitioner

## 2023-04-28 ENCOUNTER — Ambulatory Visit (HOSPITAL_COMMUNITY)
Admission: RE | Admit: 2023-04-28 | Discharge: 2023-04-28 | Disposition: A | Discharge status: Home / Self Care | Payer: Medicaid Other | Source: Ambulatory Visit | Attending: Rheumatology | Admitting: Rheumatology

## 2023-04-28 DIAGNOSIS — Z79899 Other long term (current) drug therapy: Secondary | ICD-10-CM | POA: Insufficient documentation

## 2023-04-29 ENCOUNTER — Other Ambulatory Visit: Payer: Self-pay | Admitting: Nurse Practitioner

## 2023-04-29 ENCOUNTER — Encounter: Payer: Self-pay | Admitting: Nurse Practitioner

## 2023-04-29 DIAGNOSIS — B370 Candidal stomatitis: Secondary | ICD-10-CM

## 2023-04-29 MED ORDER — NYSTATIN 100000 UNIT/ML MT SUSP
5.0000 mL | Freq: Four times a day (QID) | OROMUCOSAL | 1 refills | Status: DC
Start: 2023-04-29 — End: 2024-01-27
  Filled 2023-04-29: qty 60, 3d supply, fill #0
  Filled 2023-07-07: qty 60, 3d supply, fill #1

## 2023-04-29 NOTE — Progress Notes (Unsigned)
Office Visit Note  Patient: Brady Stephens             Date of Birth: May 21, 1976           MRN: [id number]             PCP: Claiborne Rigg, NP Referring: Claiborne Rigg, NP Visit Date: 05/13/2023 Occupation: @GUAROCC @  Subjective:  Discuss results and treatment options  History of Present Illness: Brady Stephens is a 47 y.o. male with newly diagnosed psoriatic arthritis and osteoarthritis. Patient presents today to discuss x-ray and lab results as well as to discuss treatment options.  He continues to have significant pain and inflammation involving both hands and both feet.  He is also having ongoing pain in both SI joints.  His morning stiffness has been lasting into the afternoon up to 6 hours daily.  He has been experiencing nocturnal pain as well as difficulty performing ADLs.  Patient is ready to proceed with initiating treatment pending insurance approval.   Activities of Daily Living:  Patient reports morning stiffness for 5-6 hours.   Patient Reports nocturnal pain.  Difficulty dressing/grooming: Reports Difficulty climbing stairs: Reports Difficulty getting out of chair: Reports Difficulty using hands for taps, buttons, cutlery, and/or writing: Reports  Review of Systems  Constitutional:  Positive for fatigue.  HENT:  Negative for mouth sores and mouth dryness.   Eyes:  Positive for pain, redness, visual disturbance and dryness. Negative for photophobia.  Respiratory:  Negative for shortness of breath.   Cardiovascular:  Negative for chest pain and palpitations.  Gastrointestinal:  Positive for constipation. Negative for blood in stool and diarrhea.  Endocrine: Negative for increased urination.  Genitourinary:  Negative for involuntary urination.  Musculoskeletal:  Positive for joint pain, gait problem, joint pain, joint swelling and morning stiffness. Negative for myalgias, muscle weakness, muscle tenderness and myalgias.  Skin:  Negative for color change, rash,  hair loss and sensitivity to sunlight.  Allergic/Immunologic: Positive for susceptible to infections.  Neurological:  Positive for headaches. Negative for dizziness.  Hematological:  Negative for swollen glands.  Psychiatric/Behavioral:  Positive for sleep disturbance. Negative for depressed mood. The patient is not nervous/anxious.     PMFS History:  Patient Active Problem List   Diagnosis Date Noted  . Uncontrolled type 2 diabetes mellitus with hyperglycemia, with long-term current use of insulin (HCC) 12/16/2021    Past Medical History:  Diagnosis Date  . Carpal tunnel syndrome, bilateral   . DM type 2, not at goal [facility])     Family History  Problem Relation Age of Onset  . Arthritis Mother   . Gout Maternal Grandfather   . Healthy Son   . Healthy Son   . Eczema Son   . Healthy Daughter   . Eczema Daughter   . Healthy Daughter   . Healthy Daughter    Past Surgical History:  Procedure Laterality Date  . EYE SURGERY     6 months old  . HERNIA REPAIR     as child  . SPINAL CORD STIMULATOR TRIAL     Social History   Social History Narrative  . Not on file   There is no immunization history for the selected administration types on file for this patient.   Objective: Vital Signs: BP 125/80 (BP Location: Left Arm, Patient Position: Sitting, Cuff Size: Normal)   Pulse 88   Resp 16   Ht 5\' 8"  (1.727 m)   Wt 216 lb 3.2 oz (  98.1 kg)   BMI 32.87 kg/m    Physical Exam Vitals and nursing note reviewed.  Constitutional:      Appearance: He is well-developed.  HENT:     Head: Normocephalic and atraumatic.  Eyes:     Conjunctiva/sclera: Conjunctivae normal.     Pupils: Pupils are equal, round, and reactive to light.  Cardiovascular:     Rate and Rhythm: Normal rate and regular rhythm.     Heart sounds: Normal heart sounds.  Pulmonary:     Effort: Pulmonary effort is normal.     Breath sounds: Normal breath sounds.  Abdominal:     General: Bowel sounds are normal.      Palpations: Abdomen is soft.  Musculoskeletal:     Cervical back: Normal range of motion and neck supple.  Skin:    General: Skin is warm and dry.     Capillary Refill: Capillary refill takes less than 2 seconds.  Neurological:     Mental Status: He is alert and oriented to person, place, and time.  Psychiatric:        Behavior: Behavior normal.     Musculoskeletal Exam: C-spine has good range of motion.  Limited mobility of the lumbar spine.  Tenderness over both SI joints.  Shoulder joints, elbow joints, and wrist joints have good range of motion with no synovitis.  Thickening of all DIP joints with synovitis.  No dystrophy noted.  Tenderness and inflammation noted in the left fourth PIP joint.  Hip joints have good range of motion with no groin pain.  Knee joints have good range of motion with no warmth or effusion.  Ankle joints have good range of motion with no tenderness or joint swelling.  Pain in MTP and DIP joints.  CDAI Exam: CDAI Score: -- Patient Global: --; Provider Global: -- Swollen: --; Tender: -- Joint Exam 05/13/2023   No joint exam has been documented for this visit   There is currently no information documented on the homunculus. Go to the Rheumatology activity and complete the homunculus joint exam.  Investigation: No additional findings.  Imaging: DG Chest 2 View  Result Date: 05/04/2023 CLINICAL DATA:  Patient on immunosuppressive therapy. No current symptoms. EXAM: CHEST - 2 VIEW COMPARISON:  None Available. FINDINGS: Lungs clear. Heart size normal. No pneumothorax or pleural fluid. No acute or focal bony abnormality. IMPRESSION: Negative chest. Electronically Signed   By: Drusilla Kanner M.D.   On: 05/04/2023 11:17   XR Hand 2 View Left  Result Date: 04/15/2023 [facility] narrowing was noted.  PIP and DIP narrowing was noted.  Tapering of the distal phalanx tufts was noted.  Erosive changes were noted in the distal phalanx tufts and DIPs.  No MCP, intercarpal  or radiocarpal joint space narrowing was noted. Impression: These findings are suggestive of psoriatic arthritis and osteoarthritis overlap.  XR Hand 2 View Right  Result Date: 04/15/2023 [facility] narrowing was noted.  PIP and DIP narrowing was noted.  Tapering of the distal phalanx tufts was noted.  Erosive changes were noted in the distal phalanx tufts and DIPs.  No MCP, intercarpal or radiocarpal joint space narrowing was noted. Impression: These findings are suggestive of psoriatic arthritis and osteoarthritis overlap.  XR Pelvis 1-2 Views  Result Date: 04/15/2023 No SI joint sclerosis or narrowing was noted.  No erosive changes were noted.  No significant hip joint narrowing was noted. Impression: Unremarkable x-rays of the SI joints.  XR Lumbar Spine 2-3 Views  Result Date: 04/15/2023  Levoscoliosis was noted.  No significant disc space narrowing was noted.  Anterior osteophytes was noted.  No syndesmophytes were noted.  Facet joint arthropathy was noted. Impression: These findings are suggestive of levoscoliosis mild spondylosis and facet joint arthropathy.   Recent Labs: Lab Results  Component Value Date   WBC 5.8 04/15/2023   HGB 17.0 04/15/2023   PLT 294 04/15/2023   NA 138 04/15/2023   K 4.7 04/15/2023   CL 101 04/15/2023   CO2 26 04/15/2023   GLUCOSE 125 (H) 04/15/2023   BUN 14 04/15/2023   CREATININE 1.07 04/15/2023   BILITOT 0.3 04/15/2023   ALKPHOS 83 04/14/2023   AST 13 04/15/2023   ALT 15 04/15/2023   PROT 7.6 04/15/2023   PROT 7.6 04/15/2023   ALBUMIN 4.3 04/14/2023   CALCIUM 10.0 04/15/2023   QFTBGOLDPLUS NEGATIVE 04/15/2023    Speciality Comments: No specialty comments available.  Procedures:  No procedures performed Allergies: Patient has no known allergies.   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  Nail dystrophy: Patient has  severe erosive psoriatic arthritis which will require aggressive treatment to adequately control his symptoms.  He continues to have significant pain and inflammation involving both hands and both feet.  He is also having ongoing SI joint pain bilaterally.  He is been experiencing nocturnal pain as well as morning stiffness lasting up to 6 hours daily.  He has had difficulty performing ADLs as well as ambulating at times due to the severity of his symptoms.  X-rays of both hands from his initial office visit were consistent with erosive psoriatic arthritis and osteoarthritis overlap.  He also has erosions in both feet noted on x-rays from March 2024. Different treatment options were discussed today in detail.  Plan to apply for Humira 40 mg sq injections every 14 days due to severity of his symptoms.  Indications, contraindications, and potential side effects of Humira were discussed today in detail.  All questions were addressed and consent was obtained.  Once Humira has been approved to return to the office for ministration of the first injection.  He will require updated lab work in 1 month and every 3 months to monitor for drug toxicity.  He will notify us if he cannot tolerate taking Humira.  He will follow-up in the office in 6 to 8 weeks to assess his response.- Plan: Ambulatory referral to Dermatology  Psoriasis - Scalp and ear canals-Hyperpigmentation noted.  He will be initiating Humira once approved by insurance. Plan: Ambulatory referral to Dermatology  Counseled patient that Humira is a TNF blocking agent.  Counseled patient on purpose, proper use, and adverse effects of Humira.  Reviewed the most common adverse effects including infections, headache, and injection site reactions. Discussed that there is the possibility of an increased risk of malignancy including non-melanoma skin cancer but it is not well understood if this increased risk is due to the medication or the disease state.  Advised  patient to get yearly dermatology exams due to risk of skin cancer. Counseled patient that Humira should be held prior to scheduled surgery.  Counseled patient to avoid live vaccines while on Humira.  Recommend annual influenza, PCV 15 or PCV20 or Pneumovax 23, and Shingrix as indicated.  Reviewed the importance of regular labs while on Humira therapy.  Will monitor CBC and CMP 1 month after starting and then every 3 months routinely  thereafter. Will monitor TB gold annually. Standing orders placed.    Provided patient with medication education material and answered all questions.  Patient consented to Humira.  Will upload consent into the media tab.  Reviewed storage instructions of Humira.  Advised initial injection must be administered in office.  Patient verbalized understanding.   Dermatology referral was placed today.  Dose will be for psoriatic arthritis Humira 40 mg every 14 days.  Prescription pending lab results and/or insurance approval.  High risk medication use -Plan to apply for Humira 40 mg sq injections once every 14 days. CXR 04/28/23.  CBC and CMP updated on 04/15/23.  He will require updated lab work in 1 month then every 3 months.  TB gold negative on 04/15/23.  TB gold will be repeated yearly. Baseline immunosuppressive labs negative on 04/15/23.  Discussed the importance of holding Humira if he develops signs or symptoms of an infection and to resume once the infection is completely cleared. - Plan: Ambulatory referral to Dermatology  Primary osteoarthritis of both hands - XR suggestive of psoriatic arthritis and osteoarthritis overlap.  Severe arthritis and erosive changes noted on x-rays from 04/15/23.  He will require aggressive treatment for management of psoriatic arthritis due to the severity of his symptoms and erosive changes.  Bilateral foot pain: X-rays of both feet were updated on 10/31/22--erosive changes noted.  Patient has ongoing pain in both feet.  He has difficulty  ambulating at times due to the severity of pain and inflammation.  He has ongoing pain secondary to plantar fasciitis as well.  He will benefit from the initiation of Humira due to the severity of his symptoms as discussed above.  Chronic SI joint pain - Unremarkable XR of pelvis.  No erosive changes.  Tenderness of both SI joints.  Nocturnal pain.  Morning stiffness lasting 5 to 6 hours daily.  Chronic midline low back pain with bilateral sciatica - XR lumbar 04/15/23: findings are suggestive of levoscoliosis mild spondylosis and facet joint arthropathy. Chronic pain.   Other fatigue: Stable.   Other medical conditions are listed as follows:   Uncontrolled type 2 diabetes mellitus with hyperglycemia, with long-term current use of insulin (HCC)  Mixed hyperlipidemia  Orders: Orders Placed This Encounter  Procedures  . Ambulatory referral to Dermatology   No orders of the defined types were placed in this encounter.   Follow-Up Instructions: Return in about 8 weeks (around 07/08/2023) for Psoriatic arthritis.   Gearldine Bienenstock, PA-C  Note - This record has been created using Dragon software.  Chart creation errors have been sought, but may not always  have been located. Such creation errors do not reflect on  the standard of medical care.

## 2023-04-30 ENCOUNTER — Other Ambulatory Visit: Payer: Self-pay

## 2023-05-04 NOTE — Progress Notes (Signed)
Cast x-ray is normal

## 2023-05-13 ENCOUNTER — Encounter: Payer: Self-pay | Admitting: Physician Assistant

## 2023-05-13 ENCOUNTER — Telehealth: Payer: Self-pay | Admitting: Pharmacist

## 2023-05-13 ENCOUNTER — Ambulatory Visit: Payer: Medicaid Other | Admitting: Rheumatology

## 2023-05-13 ENCOUNTER — Ambulatory Visit: Payer: Medicaid Other | Attending: Rheumatology | Admitting: Physician Assistant

## 2023-05-13 VITALS — BP 125/80 | HR 88 | Resp 16 | Ht 68.0 in | Wt 216.2 lb

## 2023-05-13 DIAGNOSIS — L409 Psoriasis, unspecified: Secondary | ICD-10-CM

## 2023-05-13 DIAGNOSIS — M79671 Pain in right foot: Secondary | ICD-10-CM

## 2023-05-13 DIAGNOSIS — M5442 Lumbago with sciatica, left side: Secondary | ICD-10-CM

## 2023-05-13 DIAGNOSIS — L4050 Arthropathic psoriasis, unspecified: Secondary | ICD-10-CM

## 2023-05-13 DIAGNOSIS — M19041 Primary osteoarthritis, right hand: Secondary | ICD-10-CM

## 2023-05-13 DIAGNOSIS — E782 Mixed hyperlipidemia: Secondary | ICD-10-CM

## 2023-05-13 DIAGNOSIS — Z794 Long term (current) use of insulin: Secondary | ICD-10-CM

## 2023-05-13 DIAGNOSIS — Z79899 Other long term (current) drug therapy: Secondary | ICD-10-CM | POA: Diagnosis not present

## 2023-05-13 DIAGNOSIS — E1165 Type 2 diabetes mellitus with hyperglycemia: Secondary | ICD-10-CM

## 2023-05-13 DIAGNOSIS — G8929 Other chronic pain: Secondary | ICD-10-CM

## 2023-05-13 DIAGNOSIS — M5441 Lumbago with sciatica, right side: Secondary | ICD-10-CM

## 2023-05-13 DIAGNOSIS — M79672 Pain in left foot: Secondary | ICD-10-CM

## 2023-05-13 DIAGNOSIS — M19042 Primary osteoarthritis, left hand: Secondary | ICD-10-CM

## 2023-05-13 DIAGNOSIS — M533 Sacrococcygeal disorders, not elsewhere classified: Secondary | ICD-10-CM

## 2023-05-13 DIAGNOSIS — R5383 Other fatigue: Secondary | ICD-10-CM

## 2023-05-13 NOTE — Progress Notes (Signed)
Pharmacy Note Subjective: Patient presents today to [facility] for follow up office visit. Patient seen by the pharmacist for counseling on Humira for psoriatic arthritis and plaque psoriasis.  He is naive to treatment.  Diagnosis of heart failure: No  Objective:  CBC    Component Value Date/Time   WBC 5.8 04/15/2023 0952   RBC 5.53 04/15/2023 0952   HGB 17.0 04/15/2023 0952   HGB 16.5 04/14/2023 1614   HCT 50.2 (H) 04/15/2023 0952   HCT 47.9 04/14/2023 1614   PLT 294 04/15/2023 0952   PLT 297 04/14/2023 1614   MCV 90.8 04/15/2023 0952   MCV 89 04/14/2023 1614   MCH 30.7 04/15/2023 0952   MCHC 33.9 04/15/2023 0952   RDW 13.2 04/15/2023 0952   RDW 13.6 04/14/2023 1614   LYMPHSABS 2,442 04/15/2023 0952   LYMPHSABS 3.0 04/14/2023 1614   MONOABS 0.4 05/11/2008 1000   EOSABS 58 04/15/2023 0952   EOSABS 0.1 04/14/2023 1614   BASOSABS 58 04/15/2023 0952   BASOSABS 0.1 04/14/2023 1614     CMP     Component Value Date/Time   NA 138 04/15/2023 0952   NA 139 04/14/2023 1614   K 4.7 04/15/2023 0952   CL 101 04/15/2023 0952   CO2 26 04/15/2023 0952   GLUCOSE 125 (H) 04/15/2023 0952   BUN 14 04/15/2023 0952   BUN 10 04/14/2023 1614   CREATININE 1.07 04/15/2023 0952   CALCIUM 10.0 04/15/2023 0952   PROT 7.6 04/15/2023 0952   PROT 7.6 04/15/2023 0952   PROT 7.2 04/14/2023 1614   ALBUMIN 4.3 04/14/2023 1614   AST 13 04/15/2023 0952   ALT 15 04/15/2023 0952   ALKPHOS 83 04/14/2023 1614   BILITOT 0.3 04/15/2023 0952   BILITOT 0.4 04/14/2023 1614   GFRNONAA >60 12/17/2021 0047      Baseline Immunosuppressant Therapy Labs TB GOLD    Latest Ref Rng & Units 04/15/2023    9:52 AM  Quantiferon TB Gold  Quantiferon TB Gold Plus NEGATIVE NEGATIVE    Hepatitis Panel    Latest Ref Rng & Units 04/15/2023    9:52 AM  Hepatitis  Hep B Surface Ag NON-REACTIVE NON-REACTIVE   Hep B IgM NON-REACTIVE NON-REACTIVE   Hep C Ab NON-REACTIVE NON-REACTIVE    HIV Lab  Results  Component Value Date   HIV Non Reactive 12/17/2021   Immunoglobulins    Latest Ref Rng & Units 04/15/2023    9:52 AM  Immunoglobulin Electrophoresis  IgA  47 - 310 mg/dL 604   IgG 540 - 9,811 mg/dL 9,147   IgM 50 - 829 mg/dL 63    SPEP    Latest Ref Rng & Units 04/15/2023    9:52 AM  Serum Protein Electrophoresis  Total Protein 6.1 - 8.1 g/dL 6.1 - 8.1 g/dL 7.6    7.6   Albumin 3.8 - 4.8 g/dL 4.4   Alpha-1 0.2 - 0.3 g/dL 0.3   Alpha-2 0.5 - 0.9 g/dL 0.6   Beta Globulin 0.4 - 0.6 g/dL 0.5   Beta 2 0.2 - 0.5 g/dL 0.5   Gamma Globulin 0.8 - 1.7 g/dL 1.3   Interpretation  --    Chest x-ray: 05/04/2023  Assessment/Plan:  Patient has aggressive disease and MTX,leflunomide, Plaquenil would not be as effective  Counseled patient that Humira is a TNF blocking agent.  Counseled patient on purpose, proper use, and adverse effects of Humira.  Reviewed the most common adverse effects including infections, headache, and injection  site reactions. Discussed that there is the possibility of an increased risk of malignancy including non-melanoma skin cancer but it is not well understood if this increased risk is due to the medication or the disease state.  Advised patient to get yearly dermatology exams due to risk of skin cancer. Counseled patient that Humira should be held prior to scheduled surgery.  Counseled patient to avoid live vaccines while on Humira.  Recommend annual influenza, PCV 15 or PCV20 or Pneumovax 23, and Shingrix as indicated.  Reviewed the importance of regular labs while on Humira therapy. Will monitor CBC and CMP 1 month after starting and then every 3 months routinely thereafter. Will monitor TB gold annually. Standing orders placed. Provided patient with medication education material and answered all questions.  Patient consented to Humira.  Will upload consent into the media tab.  Reviewed storage instructions of Humira.  Advised initial injection must be  administered in office.  Patient verbalized understanding.   Dermatology referral was placed today. - for yearly skin checks while on TNF inhibitors and for psoriasis co-management  Dose will be for psoriatic arthritis/psoriasis Humira 40 mg every 14 days. Prescription pending lab results and/or insurance approval.  Chesley Mires, PharmD, MPH, BCPS, CPP Clinical Pharmacist (Rheumatology and Pulmonology)

## 2023-05-13 NOTE — Telephone Encounter (Signed)
Please start Humira BIV  Dose: 40mg  SQ every 14 days  DX: PsA + PsO  Chesley Mires, PharmD, MPH, BCPS, CPP Clinical Pharmacist (Rheumatology and Pulmonology)

## 2023-05-13 NOTE — Patient Instructions (Signed)
Standing Labs We placed an order today for your standing lab work.   Please have your standing labs drawn in 1 month and then every 3 months   Please have your labs drawn 2 weeks prior to your appointment so that the provider can discuss your lab results at your appointment, if possible.  Please note that you may see your imaging and lab results in MyChart before we have reviewed them. We will contact you once all results are reviewed. Please allow our office up to 72 hours to thoroughly review all of the results before contacting the office for clarification of your results.  WALK-IN LAB HOURS  Monday through Thursday from 8:00 am -12:30 pm and 1:00 pm-5:00 pm and Friday from 8:00 am-12:00 pm.  Patients with office visits requiring labs will be seen before walk-in labs.  You may encounter longer than normal wait times. Please allow additional time. Wait times may be shorter on  Monday and Thursday afternoons.  We do not book appointments for walk-in labs. We appreciate your patience and understanding with our staff.   Labs are drawn by [facility]. Please bring your co-pay at the time of your lab draw.  You may receive a bill from [facility] for your lab work.  Please note if you are on Hydroxychloroquine and and an order has been placed for a Hydroxychloroquine level,  you will need to have it drawn 4 hours or more after your last dose.  If you wish to have your labs drawn at another location, please call the office 24 hours in advance so we can fax the orders.  The office is located at [address], [address], [place], [place] [address]   If you have any questions regarding directions or hours of operation,  please call [phone].   As a reminder, please drink plenty of water prior to coming for your lab work. Thanks!   Adalimumab Injection What is this medication? ADALIMUMAB (ay da LIM yoo mab) treats autoimmune conditions, such as psoriasis, arthritis, Crohn's disease, and ulcerative  colitis. It works by slowing down an overactive immune system. It belongs to a group of medications called TNF inhibitors. It is a monoclonal antibody. This medicine may be used for other purposes; ask your health care provider or pharmacist if you have questions. COMMON BRAND NAME(S): ABRILADA, AMJEVITA, CYLTEZO, HADLIMA, Hulio, Hulio PEN, Humira, HUMIRA PEN, Hyrimoz, Idacio, Simlandi, Yuflyma, YUSIMRY What should I tell my care team before I take this medication? They need to know if you have any of these conditions: Cancer Diabetes (high blood sugar) Having surgery Heart disease Hepatitis B Immune system problems Infections, such as tuberculosis (TB) or other bacterial, fungal, or viral infections Multiple sclerosis Recent or upcoming vaccine An unusual or allergic reaction to adalimumab, mannitol, latex, rubber, other medications, foods, dyes, or preservatives Pregnant or trying to get pregnant Breast-feeding How should I use this medication? This medication is injected under the skin. It may be given by your care team in a hospital or clinic setting. It may also be given at home. If you get this medication at home, you will be taught how to prepare and give it. Use exactly as directed. Take it as directed on the prescription label. Keep taking it unless your care team tells you to stop. This medication comes with INSTRUCTIONS FOR USE. Ask your pharmacist for directions on how to use this medication. Read the information carefully. Talk to your pharmacist or care team if you have questions. It is  important that you put your used needles and syringes in a special sharps container. Do not put them in a trash can. If you do not have a sharps container, call your pharmacist or care team to get one. A special MedGuide will be given to you by the pharmacist with each prescription and refill. If you are getting this medication in a hospital or clinic, a special MedGuide will be given to you before  each treatment. Be sure to read this information carefully each time. Talk to your care team about the use of this medication in children. While it be prescribed for children as young as 2 years for selected conditions, precautions do apply. Overdosage: If you think you have taken too much of this medicine contact a poison control center or emergency room at once. NOTE: This medicine is only for you. Do not share this medicine with others. What if I miss a dose? If you get this medication at the hospital or clinic: it is important not to miss your dose. Call your care team if you are unable to keep an appointment. If you give yourself this medication at home: If you miss a dose, take it as soon as you can. If it is almost time for your next dose, take only that dose. Do not take double or extra doses. Call your care team with questions. What may interact with this medication? Do not take this medication with any of the following: Abatacept Anakinra Biologic medications, such as certolizumab, etanercept, golimumab, infliximab Live virus vaccines This medication may also interact with the following: Cyclosporine Theophylline Vaccines Warfarin This list may not describe all possible interactions. Give your health care provider a list of all the medicines, herbs, non-prescription drugs, or dietary supplements you use. Also tell them if you smoke, drink alcohol, or use illegal drugs. Some items may interact with your medicine. What should I watch for while using this medication? Visit your care team for regular checks on your progress. Tell your care team if your symptoms do not start to get better or if they get worse. You will be tested for tuberculosis (TB) before you start this medication. If your care team prescribes any medication for TB, you should start taking the TB medication before starting this medication. Make sure to finish the full course of TB medication. This medication may increase  your risk of getting an infection. Call your care team for advice if you get a fever, chills, sore throat, or other symptoms of a cold or flu. Do not treat yourself. Try to avoid being around people who are sick. Talk to your care team about your risk of cancer. You may be more at risk for certain types of cancer if you take this medication. What side effects may I notice from receiving this medication? Side effects that you should report to your care team as soon as possible: Allergic reactions--skin rash, itching, hives, swelling of the face, lips, tongue, or throat Aplastic anemia--unusual weakness or fatigue, dizziness, headache, trouble breathing, increased bleeding or bruising Body pain, tingling, or numbness Heart failure--shortness of breath, swelling of the ankles, feet, or hands, sudden weight gain, unusual weakness or fatigue Infection--fever, chills, cough, sore throat, wounds that don't heal, pain or trouble when passing urine, general feeling of discomfort or being unwell Lupus-like syndrome--joint pain, swelling, or stiffness, butterfly-shaped rash on the face, rashes that get worse in the sun, fever, unusual weakness or fatigue Unusual bruising or bleeding Side effects that usually do  not require medical attention (report to your care team if they continue or are bothersome): Headache Nausea Pain, redness, or irritation at injection site Runny or stuffy nose Sore throat Stomach pain This list may not describe all possible side effects. Call your doctor for medical advice about side effects. You may report side effects to FDA at 1-800-FDA-1088. Where should I keep my medication? Keep out of the reach of children and pets. Store in the refrigerator. Do not freeze. Keep this medication in the original packaging until you are ready to take it. Protect from light. Get rid of any unused medication after the expiration date. This medication may be stored at room temperature for up to  14 days. Keep this medication in the original packaging. Protect from light. If it is stored at room temperature, get rid of any unused medication after 14 days or after it expires, whichever is first. To get rid of medications that are no longer needed or have expired: Take the medication to a medication take-back program. Check with your pharmacy or law enforcement to find a location. If you cannot return the medication, ask your pharmacist or care team how to get rid of this medication safely. NOTE: This sheet is a summary. It may not cover all possible information. If you have questions about this medicine, talk to your doctor, pharmacist, or health care provider.  2024 Elsevier/Gold Standard (2021-11-01 00:00:00)

## 2023-05-14 NOTE — Telephone Encounter (Signed)
Submitted a Prior Authorization request to [facility] Medicaid for HUMIRA via CoverMyMeds. Will update once we receive a response.  Key: [id number]

## 2023-05-15 ENCOUNTER — Other Ambulatory Visit (HOSPITAL_COMMUNITY): Payer: Self-pay

## 2023-05-15 ENCOUNTER — Other Ambulatory Visit: Payer: Self-pay

## 2023-05-15 MED ORDER — HUMIRA (2 PEN) 40 MG/0.4ML ~~LOC~~ AJKT
40.0000 mg | AUTO-INJECTOR | SUBCUTANEOUS | 0 refills | Status: DC
  Filled 2023-05-15: qty 2, 28d supply, fill #0

## 2023-05-15 NOTE — Telephone Encounter (Signed)
Received notification from [facility] Medicaid regarding a prior authorization for HUMIRA. Authorization has been APPROVED from 05/14/23 to 05/13/24. Approval letter sent to scan center.  Per test claim, copay for 28 days supply is $4  Patient can fill through [facility]: [phone]   Authorization # [id number] Phone # [phone]  Patient can be scheduled for Humira new start. Rx sent to [facility] to be couriered to clinic by 05/21/23  Chesley Mires, PharmD, MPH, BCPS, CPP Clinical Pharmacist (Rheumatology and Pulmonology)

## 2023-05-18 NOTE — Progress Notes (Deleted)
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 He is treatment naive.   Patient running a fever or have signs/symptoms of infection? {yes/no:20286}  Patient currently on antibiotics for the treatment of infection? {yes/no:20286}  Patient have any upcoming invasive procedures/surgeries? {yes/no:20286}  Objective: CMP     Component Value Date/Time   NA 138 04/15/2023 0952   NA 139 04/14/2023 1614   K 4.7 04/15/2023 0952   CL 101 04/15/2023 0952   CO2 26 04/15/2023 0952   GLUCOSE 125 (H) 04/15/2023 0952   BUN 14 04/15/2023 0952   BUN 10 04/14/2023 1614   CREATININE 1.07 04/15/2023 0952   CALCIUM 10.0 04/15/2023 0952   PROT 7.6 04/15/2023 0952   PROT 7.6 04/15/2023 0952   PROT 7.2 04/14/2023 1614   ALBUMIN 4.3 04/14/2023 1614   AST 13 04/15/2023 0952   ALT 15 04/15/2023 0952   ALKPHOS 83 04/14/2023 1614   BILITOT 0.3 04/15/2023 0952   BILITOT 0.4 04/14/2023 1614   GFRNONAA >60 12/17/2021 0047    CBC    Component Value Date/Time   WBC 5.8 04/15/2023 0952   RBC 5.53 04/15/2023 0952   HGB 17.0 04/15/2023 0952   HGB 16.5 04/14/2023 1614   HCT 50.2 (H) 04/15/2023 0952   HCT 47.9 04/14/2023 1614   PLT 294 04/15/2023 0952   PLT 297 04/14/2023 1614   MCV 90.8 04/15/2023 0952   MCV 89 04/14/2023 1614   MCH 30.7 04/15/2023 0952   MCHC 33.9 04/15/2023 0952   RDW 13.2 04/15/2023 0952   RDW 13.6 04/14/2023 1614   LYMPHSABS 2,442 04/15/2023 0952   LYMPHSABS 3.0 04/14/2023 1614   MONOABS 0.4 05/11/2008 1000   EOSABS 58 04/15/2023 0952   EOSABS 0.1 04/14/2023 1614   BASOSABS 58 04/15/2023 0952   BASOSABS 0.1 04/14/2023 1614    Baseline Immunosuppressant Therapy Labs TB GOLD    Latest Ref Rng & Units 04/15/2023    9:52 AM  Quantiferon TB Gold  Quantiferon TB Gold Plus NEGATIVE NEGATIVE    Hepatitis Panel    Latest Ref Rng & Units 04/15/2023    9:52 AM  Hepatitis   Hep B Surface Ag NON-REACTIVE NON-REACTIVE   Hep B IgM NON-REACTIVE NON-REACTIVE   Hep C Ab NON-REACTIVE NON-REACTIVE    HIV Lab Results  Component Value Date   HIV Non Reactive 12/17/2021   Immunoglobulins    Latest Ref Rng & Units 04/15/2023    9:52 AM  Immunoglobulin Electrophoresis  IgA  47 - 310 mg/dL 045   IgG 409 - 8,119 mg/dL 1,478   IgM 50 - 295 mg/dL 63    SPEP    Latest Ref Rng & Units 04/15/2023    9:52 AM  Serum Protein Electrophoresis  Total Protein 6.1 - 8.1 g/dL 6.1 - 8.1 g/dL 7.6    7.6   Albumin 3.8 - 4.8 g/dL 4.4   Alpha-1 0.2 - 0.3 g/dL 0.3   Alpha-2 0.5 - 0.9 g/dL 0.6   Beta Globulin 0.4 - 0.6 g/dL 0.5   Beta 2 0.2 - 0.5 g/dL 0.5   Gamma Globulin 0.8 - 1.7 g/dL 1.3   Interpretation  --    G6PD No results found for: "G6PDH" TPMT No results found for: "TPMT"   Chest x-ray 04/28/2023 FINDINGS: Lungs clear. Heart size normal. No pneumothorax or pleural fluid. No acute or focal bony abnormality.  Assessment/Plan:  Reviewed importance  of holding *** with signs/symptoms of an infections, if antibiotics are prescribed to treat an active infection, and with invasive procedures  Demonstrated proper injection technique with *** demo device  Patient able to demonstrate proper injection technique using the teach back method.  Patient self injected in the {injsitedsg:28167} with:  Sample Medication: *** NDC: *** Lot: *** Expiration: ***  Patient tolerated well.  Observed for 30 mins in office for adverse reaction. {injectionreaction:30756}  Patient is to return in 1 month for labs and 6-8 weeks for follow-up appointment.  Standing orders for CBC/CMP and *** placed.  TB gold will be monitored yearly. Lipid panel will be monitored every *** months. Referral to *** Dermatology placed today for yearly skin checks while on TNF inhibitor due to risk for non melanoma skin cancer  Humira approved through insurance .   Rx sent to: [facility]: [phone] .  Patient provided with pharmacy phone number and advised to call later this week to schedule shipment to home.  Patient will continue *** subcut every *** days in combination with ***.  All questions encouraged and answered.  Instructed patient to call with any further questions or concerns.  Chesley Mires, PharmD, MPH, BCPS, CPP Clinical Pharmacist (Rheumatology and Pulmonology)  05/18/2023 11:16 AM

## 2023-05-18 NOTE — Telephone Encounter (Signed)
Patient scheduled for Humira new start on 05/19/23. Will use sample  Chesley Mires, PharmD, MPH, BCPS, CPP Clinical Pharmacist (Rheumatology and Pulmonology)

## 2023-05-19 ENCOUNTER — Telehealth: Payer: Self-pay | Admitting: Rheumatology

## 2023-05-19 ENCOUNTER — Ambulatory Visit: Payer: Medicaid Other | Admitting: Pharmacist

## 2023-05-19 NOTE — Telephone Encounter (Signed)
R/s for 05/20/23

## 2023-05-19 NOTE — Telephone Encounter (Signed)
Pt called to cancel appt with the pharmacy for Humira new start/sample today 09/17. Would like to reschedule appointment with [facility].

## 2023-05-19 NOTE — Telephone Encounter (Signed)
Pt rescheduled for Humira new start on 05/20/23

## 2023-05-20 ENCOUNTER — Ambulatory Visit: Payer: Medicaid Other | Admitting: Pharmacist

## 2023-05-20 NOTE — Telephone Encounter (Signed)
Patient r/s for Humira new start on 05/26/23  Chesley Mires, PharmD, MPH, BCPS, CPP Clinical Pharmacist (Rheumatology and Pulmonology)

## 2023-05-21 ENCOUNTER — Other Ambulatory Visit (HOSPITAL_COMMUNITY): Payer: Self-pay

## 2023-05-21 NOTE — Patient Instructions (Signed)
Your next HUMIRA dose is due on 06/09/23, 06/23/23, and every 14 days thereafter  HOLD HUMIRA if you have signs or symptoms of an infection. You can resume once you feel better or back to your baseline. HOLD HUMIRA if you start antibiotics to treat an infection. HOLD HUMIRA around the time of surgery/procedures. Your surgeon will be able to provide recommendations on when to hold BEFORE and when you are cleared to RESUME.  Pharmacy information: Your prescription will be shipped from [facility]. Their phone number is [phone] Mills Koller will call to schedule first shipment. They will call to schedule shipment and confirm address. They will mail your medication to your home.  Labs are due in 1 month then every 3 months. Lab hours are from Monday to Thursday 8am-12:30pm and 1pm-5pm and Friday 8am-12pm. You do not need an appointment if you come for labs during these times. If you'd like to go to a [facility] or [facility] closer to home, please call our clinic 48 hours prior to lab date so we can release orders in a timely manner.  How to manage an injection site reaction: Remember the 5 C's: COUNTER - leave on the counter at least 30 minutes but up to overnight to bring medication to room temperature. This may help prevent stinging COLD - place something cold (like an ice gel pack or cold water bottle) on the injection site just before cleansing with alcohol. This may help reduce pain CLARITIN - use Claritin (generic name is loratadine) for the first two weeks of treatment or the day of, the day before, and the day after injecting. This will help to minimize injection site reactions CORTISONE CREAM - apply if injection site is irritated and itching CALL ME - if injection site reaction is bigger than the size of your fist, looks infected, blisters, or if you develop hives

## 2023-05-21 NOTE — Progress Notes (Signed)
Pharmacy Note  Subjective:   Patient presents to clinic today to receive first dose of Humira for PsA + PsO. He is naive to DMARDs.  Patient running a fever or have signs/symptoms of infection? No  Patient currently on antibiotics for the treatment of infection? No  Patient have any upcoming invasive procedures/surgeries? No  Objective: CMP     Component Value Date/Time   NA 138 04/15/2023 0952   NA 139 04/14/2023 1614   K 4.7 04/15/2023 0952   CL 101 04/15/2023 0952   CO2 26 04/15/2023 0952   GLUCOSE 125 (H) 04/15/2023 0952   BUN 14 04/15/2023 0952   BUN 10 04/14/2023 1614   CREATININE 1.07 04/15/2023 0952   CALCIUM 10.0 04/15/2023 0952   PROT 7.6 04/15/2023 0952   PROT 7.6 04/15/2023 0952   PROT 7.2 04/14/2023 1614   ALBUMIN 4.3 04/14/2023 1614   AST 13 04/15/2023 0952   ALT 15 04/15/2023 0952   ALKPHOS 83 04/14/2023 1614   BILITOT 0.3 04/15/2023 0952   BILITOT 0.4 04/14/2023 1614   GFRNONAA >60 12/17/2021 0047    CBC    Component Value Date/Time   WBC 5.8 04/15/2023 0952   RBC 5.53 04/15/2023 0952   HGB 17.0 04/15/2023 0952   HGB 16.5 04/14/2023 1614   HCT 50.2 (H) 04/15/2023 0952   HCT 47.9 04/14/2023 1614   PLT 294 04/15/2023 0952   PLT 297 04/14/2023 1614   MCV 90.8 04/15/2023 0952   MCV 89 04/14/2023 1614   MCH 30.7 04/15/2023 0952   MCHC 33.9 04/15/2023 0952   RDW 13.2 04/15/2023 0952   RDW 13.6 04/14/2023 1614   LYMPHSABS 2,442 04/15/2023 0952   LYMPHSABS 3.0 04/14/2023 1614   MONOABS 0.4 05/11/2008 1000   EOSABS 58 04/15/2023 0952   EOSABS 0.1 04/14/2023 1614   BASOSABS 58 04/15/2023 0952   BASOSABS 0.1 04/14/2023 1614    Baseline Immunosuppressant Therapy Labs TB GOLD    Latest Ref Rng & Units 04/15/2023    9:52 AM  Quantiferon TB Gold  Quantiferon TB Gold Plus NEGATIVE NEGATIVE    Hepatitis Panel    Latest Ref Rng & Units 04/15/2023    9:52 AM  Hepatitis  Hep B Surface Ag NON-REACTIVE NON-REACTIVE   Hep B IgM NON-REACTIVE  NON-REACTIVE   Hep C Ab NON-REACTIVE NON-REACTIVE    HIV Lab Results  Component Value Date   HIV Non Reactive 12/17/2021   Immunoglobulins    Latest Ref Rng & Units 04/15/2023    9:52 AM  Immunoglobulin Electrophoresis  IgA  47 - 310 mg/dL 366   IgG 440 - 3,474 mg/dL 2,595   IgM 50 - 638 mg/dL 63    SPEP    Latest Ref Rng & Units 04/15/2023    9:52 AM  Serum Protein Electrophoresis  Total Protein 6.1 - 8.1 g/dL 6.1 - 8.1 g/dL 7.6    7.6   Albumin 3.8 - 4.8 g/dL 4.4   Alpha-1 0.2 - 0.3 g/dL 0.3   Alpha-2 0.5 - 0.9 g/dL 0.6   Beta Globulin 0.4 - 0.6 g/dL 0.5   Beta 2 0.2 - 0.5 g/dL 0.5   Gamma Globulin 0.8 - 1.7 g/dL 1.3   Interpretation  --    Chest x-ray: 04/28/23 - Negative chest.  Assessment/Plan:  Reviewed importance of holding HUMIRA with signs/symptoms of an infections, if antibiotics are prescribed to treat an active infection, and with invasive procedures. He was advised to alternate Humira injection site from insulin  injection site.   Demonstrated proper injection technique with Humira demo device  Patient able to demonstrate proper injection technique using the teach back method.  Patient self injected in the right lower abdomen with:  Sample Medication: Humira 40mg /0.18mL autoinjector pen NDC: 2440-1027-25  Lot: [id number] Expiration: 01/30/2024  Patient tolerated well.  Observed for 30 mins in office for adverse reaction. Patient denies itchiness and irritation at injection., No swelling or redness noted., and Reviewed injection site reaction management with patient verbally and printed information for review in AVS  Patient is to return in 1 month for labs and 6-8 weeks for follow-up appointment.  Standing orders for CBC/CMP placed. TB gold will be monitored yearly.  Referral to [facility] placed on 05/13/23 for yearly skin checks while on TNF inhibitor due to risk for non melanoma skin cancer as well as close management of psoriasis - appointment is  scheduled for 10/21/2023  Humira approved through insurance . Rx sent to: [facility]: [phone] .  Patient provided with pharmacy phone number and advised that Mills Koller will call to schedule first shipment to home.  Patient will continue Humira 40mg  subcut every 14 days as monotherapy.  All questions encouraged and answered.  Instructed patient to call with any further questions or concerns.  Sofie Rower, PharmD, [facility] PGY1  Chesley Mires, PharmD, MPH, BCPS, CPP Clinical Pharmacist (Rheumatology and Pulmonology)  05/21/2023 4:17 PM

## 2023-05-26 ENCOUNTER — Other Ambulatory Visit: Payer: Self-pay

## 2023-05-26 ENCOUNTER — Other Ambulatory Visit: Payer: Self-pay | Admitting: Pharmacist

## 2023-05-26 ENCOUNTER — Ambulatory Visit: Payer: Medicaid Other | Attending: Rheumatology | Admitting: Pharmacist

## 2023-05-26 DIAGNOSIS — L4050 Arthropathic psoriasis, unspecified: Secondary | ICD-10-CM

## 2023-05-26 DIAGNOSIS — L409 Psoriasis, unspecified: Secondary | ICD-10-CM

## 2023-05-26 DIAGNOSIS — Z79899 Other long term (current) drug therapy: Secondary | ICD-10-CM

## 2023-05-26 MED ORDER — HUMIRA (2 PEN) 40 MG/0.4ML ~~LOC~~ AJKT
40.0000 mg | AUTO-INJECTOR | SUBCUTANEOUS | 2 refills | Status: DC
  Filled 2023-05-27: qty 2, 28d supply, fill #0
  Filled 2023-06-15: qty 2, 28d supply, fill #1
  Filled 2023-07-24: qty 2, 28d supply, fill #2

## 2023-05-26 NOTE — Progress Notes (Unsigned)
Patient counseled at [facility] on 05/13/2023. Tolu, PGY1 PharmD resident, completed first dose injection with patient on 05/26/23. He was confident self-administering since he also administers insulin  CBC/CMP in 1 month then every 3 months  Chesley Mires, PharmD, MPH, BCPS, CPP Clinical Pharmacist (Rheumatology and Pulmonology)

## 2023-05-27 ENCOUNTER — Other Ambulatory Visit: Payer: Self-pay

## 2023-06-15 ENCOUNTER — Other Ambulatory Visit (HOSPITAL_COMMUNITY): Payer: Self-pay

## 2023-06-15 ENCOUNTER — Other Ambulatory Visit (HOSPITAL_COMMUNITY): Payer: Self-pay | Admitting: Pharmacy Technician

## 2023-06-15 NOTE — Progress Notes (Signed)
Specialty Pharmacy Refill Coordination Note  Brady Stephens is a 47 y.o. male contacted today regarding refills of specialty medication(s) Adalimumab   Patient requested Delivery   Delivery date: 07/01/23   Verified address: [address] [place]   Medication will be filled on 06/30/23.

## 2023-07-08 ENCOUNTER — Encounter: Payer: Self-pay | Admitting: Physician Assistant

## 2023-07-08 ENCOUNTER — Other Ambulatory Visit: Payer: Self-pay

## 2023-07-08 ENCOUNTER — Ambulatory Visit: Payer: Medicaid Other | Attending: Physician Assistant | Admitting: Physician Assistant

## 2023-07-08 VITALS — BP 122/80 | HR 71 | Resp 15 | Ht 68.0 in | Wt 213.8 lb

## 2023-07-08 DIAGNOSIS — M533 Sacrococcygeal disorders, not elsewhere classified: Secondary | ICD-10-CM

## 2023-07-08 DIAGNOSIS — L409 Psoriasis, unspecified: Secondary | ICD-10-CM | POA: Diagnosis not present

## 2023-07-08 DIAGNOSIS — Z794 Long term (current) use of insulin: Secondary | ICD-10-CM

## 2023-07-08 DIAGNOSIS — M19042 Primary osteoarthritis, left hand: Secondary | ICD-10-CM

## 2023-07-08 DIAGNOSIS — M19041 Primary osteoarthritis, right hand: Secondary | ICD-10-CM | POA: Diagnosis not present

## 2023-07-08 DIAGNOSIS — Z79899 Other long term (current) drug therapy: Secondary | ICD-10-CM | POA: Diagnosis not present

## 2023-07-08 DIAGNOSIS — G8929 Other chronic pain: Secondary | ICD-10-CM

## 2023-07-08 DIAGNOSIS — M5442 Lumbago with sciatica, left side: Secondary | ICD-10-CM

## 2023-07-08 DIAGNOSIS — R5383 Other fatigue: Secondary | ICD-10-CM

## 2023-07-08 DIAGNOSIS — E782 Mixed hyperlipidemia: Secondary | ICD-10-CM

## 2023-07-08 DIAGNOSIS — E1165 Type 2 diabetes mellitus with hyperglycemia: Secondary | ICD-10-CM

## 2023-07-08 DIAGNOSIS — L4050 Arthropathic psoriasis, unspecified: Secondary | ICD-10-CM | POA: Diagnosis not present

## 2023-07-08 DIAGNOSIS — M5441 Lumbago with sciatica, right side: Secondary | ICD-10-CM

## 2023-07-08 NOTE — Progress Notes (Signed)
CBC WNL

## 2023-07-08 NOTE — Patient Instructions (Signed)

## 2023-07-09 LAB — CBC WITH DIFFERENTIAL/PLATELET
Absolute Lymphocytes: 3136 {cells}/uL (ref 850–3900)
Absolute Monocytes: 518 {cells}/uL (ref 200–950)
Basophils Absolute: 63 {cells}/uL (ref 0–200)
Basophils Relative: 0.9 %
Eosinophils Absolute: 126 {cells}/uL (ref 15–500)
Eosinophils Relative: 1.8 %
HCT: 46.9 % (ref 38.5–50.0)
Hemoglobin: 15.8 g/dL (ref 13.2–17.1)
MCH: 31 pg (ref 27.0–33.0)
MCHC: 33.7 g/dL (ref 32.0–36.0)
MCV: 92 fL (ref 80.0–100.0)
MPV: 9.6 fL (ref 7.5–12.5)
Monocytes Relative: 7.4 %
Neutro Abs: 3157 {cells}/uL (ref 1500–7800)
Neutrophils Relative %: 45.1 %
Platelets: 252 10*3/uL (ref 140–400)
RBC: 5.1 10*6/uL (ref 4.20–5.80)
RDW: 13.7 % (ref 11.0–15.0)
Total Lymphocyte: 44.8 %
WBC: 7 10*3/uL (ref 3.8–10.8)

## 2023-07-09 LAB — COMPLETE METABOLIC PANEL WITH GFR
AG Ratio: 1.4 (calc) (ref 1.0–2.5)
ALT: 22 U/L (ref 9–46)
AST: 15 U/L (ref 10–40)
Albumin: 4.2 g/dL (ref 3.6–5.1)
Alkaline phosphatase (APISO): 71 U/L (ref 36–130)
BUN: 14 mg/dL (ref 7–25)
CO2: 29 mmol/L (ref 20–32)
Calcium: 9.3 mg/dL (ref 8.6–10.3)
Chloride: 102 mmol/L (ref 98–110)
Creat: 0.92 mg/dL (ref 0.60–1.29)
Globulin: 3 g/dL (ref 1.9–3.7)
Glucose, Bld: 129 mg/dL — ABNORMAL HIGH (ref 65–99)
Potassium: 4.1 mmol/L (ref 3.5–5.3)
Sodium: 140 mmol/L (ref 135–146)
Total Bilirubin: 0.4 mg/dL (ref 0.2–1.2)
Total Protein: 7.2 g/dL (ref 6.1–8.1)
eGFR: 102 mL/min/{1.73_m2} (ref >=60)

## 2023-07-09 NOTE — Progress Notes (Signed)
Glucose is 129. Rest of CMP WNL.

## 2023-07-20 ENCOUNTER — Ambulatory Visit: Payer: Medicaid Other | Admitting: Nurse Practitioner

## 2023-07-24 ENCOUNTER — Other Ambulatory Visit: Payer: Self-pay

## 2023-07-24 NOTE — Progress Notes (Signed)
Specialty Pharmacy Refill Coordination Note  Brady Stephens is a 47 y.o. male contacted today regarding refills of specialty medication(s) Adalimumab   Patient requested Delivery   Delivery date: 07/29/23   Verified address: [address] [place]   Medication will be filled on 07/28/23 for 08/07/23 injection.

## 2023-07-28 ENCOUNTER — Other Ambulatory Visit (HOSPITAL_COMMUNITY): Payer: Self-pay

## 2023-07-28 ENCOUNTER — Other Ambulatory Visit: Payer: Self-pay

## 2023-08-24 NOTE — Progress Notes (Signed)
 Office Visit Note  Patient: Brady Stephens             Date of Birth: [birth year]           MRN: [id number]             PCP: Theotis Haze ORN, NP Referring: Theotis Haze ORN, NP Visit Date: 09/07/2023 Occupation: @GUAROCC @  Subjective:  Pain in multiple joints   History of Present Illness: Brady Stephens is a 47 y.o. male with history of psoriatic arthritis and osteoarthritis.  Patient remains on Humira  40 mg sq injections once every 14 days.  Patient was initiated on Humira  on 05/26/2023--nave to DMARDs and Biologics.  He has been tolerating Humira  without any side effects or injection site reactions.  He has not had any recent gaps in therapy.  He denies any recent or recurrent infections.  Patient has noticed about a 30% improvement in his joint inflammation since initiating Humira .  He has noticed inflammation in his hands but continues to have chronic pain involving multiple joints.  He states that his nail dystrophy has improved.  He denies any new patches of psoriasis.  He continues to have significant discomfort in his lower back, both hands, and both feet.  His pain level is typically a 7 out of 10.  He takes methocarbamol  as needed for muscle spasms but has not been taking any over-the-counter products for pain relief.  His most recent Humira  injection was administered on 09/05/2023.  Activities of Daily Living:  Patient reports morning stiffness for 30-45 minutes.   Patient Reports nocturnal pain.  Difficulty dressing/grooming: Reports Difficulty climbing stairs: Reports Difficulty getting out of chair: Reports Difficulty using hands for taps, buttons, cutlery, and/or writing: Reports  Review of Systems  Constitutional:  Positive for fatigue. Negative for night sweats.  HENT:  Positive for mouth dryness. Negative for mouth sores and nose dryness.   Eyes:  Positive for dryness. Negative for redness.  Respiratory:  Negative for shortness of breath and difficulty breathing.    Cardiovascular:  Negative for chest pain, palpitations, hypertension, irregular heartbeat and swelling in legs/feet.  Gastrointestinal:  Positive for constipation. Negative for blood in stool and diarrhea.  Endocrine: Positive for increased urination.  Genitourinary:  Negative for painful urination and involuntary urination.  Musculoskeletal:  Positive for joint pain, joint pain, joint swelling, myalgias, morning stiffness and myalgias. Negative for gait problem, muscle weakness and muscle tenderness.  Skin:  Negative for color change, rash, hair loss, nodules/bumps, skin tightness, ulcers and sensitivity to sunlight.  Allergic/Immunologic: Negative for susceptible to infections.  Neurological:  Negative for dizziness, fainting, headaches, memory loss, night sweats and weakness.  Hematological:  Negative for swollen glands.  Psychiatric/Behavioral:  Positive for sleep disturbance. Negative for depressed mood. The patient is not nervous/anxious.     PMFS History:  Patient Active Problem List   Diagnosis Date Noted   Uncontrolled type 2 diabetes mellitus with hyperglycemia, with long-term current use of insulin  (HCC) 12/16/2021    Past Medical History:  Diagnosis Date   Carpal tunnel syndrome, bilateral    DM type 2, not at goal [facility])     Family History  Problem Relation Age of Onset   Arthritis Mother    Gout Maternal Grandfather    Healthy Son    Healthy Son    Eczema Son    Healthy Daughter    Eczema Daughter    Healthy Daughter    Healthy Daughter    Past Surgical  History:  Procedure Laterality Date   EYE SURGERY     6 months old   HERNIA REPAIR     as child   SPINAL CORD STIMULATOR TRIAL     Social History   Social History Narrative   Not on file   There is no immunization history for the selected administration types on file for this patient.   Objective: Vital Signs: BP 122/79 (BP Location: Left Arm, Patient Position: Sitting, Cuff Size: Normal)   Pulse 93    Resp 14   Ht 5' 8 (1.727 m)   Wt 216 lb (98 kg)   BMI 32.84 kg/m    Physical Exam Vitals and nursing note reviewed.  Constitutional:      Appearance: He is well-developed.  HENT:     Head: Normocephalic and atraumatic.  Eyes:     Conjunctiva/sclera: Conjunctivae normal.     Pupils: Pupils are equal, round, and reactive to light.  Cardiovascular:     Rate and Rhythm: Normal rate and regular rhythm.     Heart sounds: Normal heart sounds.  Pulmonary:     Effort: Pulmonary effort is normal.     Breath sounds: Normal breath sounds.  Abdominal:     General: Bowel sounds are normal.     Palpations: Abdomen is soft.  Musculoskeletal:     Cervical back: Normal range of motion and neck supple.  Skin:    General: Skin is warm and dry.     Capillary Refill: Capillary refill takes less than 2 seconds.  Neurological:     Mental Status: He is alert and oriented to person, place, and time.  Psychiatric:        Behavior: Behavior normal.      Musculoskeletal Exam: C-spine has good range of motion.  Limited mobility of the lumbar spine.  Some midline spinal tenderness in the lumbar region as well as over the right SI joint.  Shoulder joints, elbow joints, wrist joints have good range of motion with no synovitis.  Thickening of DIP joints with synovitis of the right index and middle DIP joint.  Tenderness and mild inflammation involving right 2nd and 3rd PIP joints.  Hip joints have good ROM with no discomfort or stiffness.  Knee joints have good ROM with no tenderness or joint swelling.  Ankle joints have good ROM with no tenderness or joint swelling.    CDAI Exam: CDAI Score: -- Patient Global: --; Provider Global: -- Swollen: --; Tender: -- Joint Exam 09/07/2023   No joint exam has been documented for this visit   There is currently no information documented on the homunculus. Go to the Rheumatology activity and complete the homunculus joint exam.  Investigation: No additional  findings.  Imaging: No results found.  Recent Labs: Lab Results  Component Value Date   WBC 7.0 07/08/2023   HGB 15.8 07/08/2023   PLT 252 07/08/2023   NA 140 07/08/2023   K 4.1 07/08/2023   CL 102 07/08/2023   CO2 29 07/08/2023   GLUCOSE 129 (H) 07/08/2023   BUN 14 07/08/2023   CREATININE 0.92 07/08/2023   BILITOT 0.4 07/08/2023   ALKPHOS 83 04/14/2023   AST 15 07/08/2023   ALT 22 07/08/2023   PROT 7.2 07/08/2023   ALBUMIN 4.3 04/14/2023   CALCIUM  9.3 07/08/2023   QFTBGOLDPLUS NEGATIVE 04/15/2023    Speciality Comments: No specialty comments available.  Procedures:  No procedures performed Allergies: Patient has no known allergies.   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  Nail dystrophy: Patient has noticed about a 30% improvement in his inflammation since initiating Humira  on 05/26/2023. He continues to have inflammation in the right index PIP and DIP joint and right 3rd PIP joint.   He has been tolerating Humira  without any side effects or injection site reactions.  He has not had any recent gaps in therapy.  No recent or recurrent infections.  He has not been taking any over-the-counter products for symptomatic relief.  His fingernail dystrophy has improved and he has noticed less inflammation in his hands but continues to have chronic pain involving his lower back, both hands, and both feet.  His pain levels have been a 7-8/10 on a daily basis.  He has also had persistent nocturnal pain in his lower back. Different treatment options were discussed today.  He is not yet ready to discontinue Humira  or switch to another biologic.  Plan to add on methotrexate  as combination therapy.  Indications, contraindications, and potential side effects of methotrexate  were discussed today in detail.  Plan to initiate methotrexate  0.6 mL sq injections once weekly  x 2 weeks and if labs are stable he will increase to 0.8 mg sq injections once weekly.  He will take folic acid  2 mg daily.  He will require updated lab work in 2 weeks x 2, 2 months, then every 3 months.  CBC and CMP were obtained today as a baseline.  A prescription for methotrexate  and folic acid  will be sent pending lab results today.  He was advised to notify us  if he cannot tolerate taking methotrexate .  He will remain on Humira  as combination therapy.  Increasing the frequency of Humira  injections to once weekly for 4 to 6 weeks could be an option in the future if he continues to have persistent pain or active inflammation. He will follow up in 6-8 weeks to assess his response.    Drug Counseling TB Gold: negative on 04/15/23.  Hepatitis panel: Hep B and C negative on 04/15/23.  Chest-xray:  04/28/23.  Alcohol use: He does not drink alcohol.   Patient was counseled on the purpose, proper use, and adverse effects of methotrexate  including nausea, infection, and signs and symptoms of pneumonitis.  Reviewed instructions with patient to take methotrexate  weekly along with folic acid  daily.  Discussed the importance of frequent monitoring of kidney and liver function and blood counts, and provided patient with standing lab instructions.  Counseled patient to avoid NSAIDs and alcohol while on methotrexate .  Provided patient with educational materials on methotrexate  and answered all questions.  Advised patient to get annual influenza vaccine and to get a pneumococcal vaccine if patient has not already had one.  Patient voiced understanding.  Patient consented to methotrexate  use.  Will upload into chart.    Psoriasis:  Hyperpigmentation was noted on the scalp and in the ear canals. No active psoriasis at this time.  Fingernail dystrophy has improved since initiating Humira .   High risk medication use - Humira  40 mg sq injections once every 14 days--started 05/26/23. CXR 04/28/23. CBC and CMP updated on  07/08/23.  Orders for CBC and CMP were released today.  He will return for updated lab work in 2 weeks x 2, 2 months, then every 3 months.  Standing orders for CBC and CMP released today.   TB gold negative on 04/15/23.   No recent or recurrent infections.  Discussed the importance of holding humira  if she develops signs or symptoms of an infection and to resume once the infection has completely cleared.   - Plan: CBC with Differential/Platelet, COMPLETE METABOLIC PANEL WITH GFR  Primary osteoarthritis of both hands - XR suggestive of psoriatic arthritis and osteoarthritis overlap.  Severe arthritis and erosive changes noted on x-rays from 04/15/23.  Patient continues to have chronic pain involving both hands.  He has noticed less inflammation in his hands but continues to have soreness and tenderness upon palpation.  Chronic SI joint pain - Unremarkable XR of pelvis.  No erosive changes.  Tenderness of both SI joints.  Nocturnal pain.  Morning stiffness lasting 5 to 6 hours daily.  He continues to have chronic pain in his lower back.  He recently had a flare lasting for about 1 week.  He continues to experience nocturnal pain in his lower back.  He has not noticed any improvement in his lower back pain since initiating Humira .  Chronic midline low back pain with bilateral sciatica: Chronic pain.  No symptoms of sciatica currently.  He has not noticed any improvement in his lower back pain since initiating Humira .  Other fatigue: Chronic, stable.    Other medical conditions are listed as follows:   Uncontrolled type 2 diabetes mellitus with hyperglycemia, with long-term current use of insulin  (HCC)  Mixed hyperlipidemia  Orders: Orders Placed This Encounter  Procedures   CBC with Differential/Platelet   COMPLETE METABOLIC PANEL WITH GFR   CBC with Differential/Platelet   COMPLETE METABOLIC PANEL WITH GFR   No orders of the defined types were placed in this encounter.    Follow-Up  Instructions: Return in about 2 months (around 11/05/2023) for Psoriatic arthritis, Osteoarthritis.   Waddell CHRISTELLA Craze, PA-C  Note - This record has been created using Dragon software.  Chart creation errors have been sought, but may not always  have been located. Such creation errors do not reflect on  the standard of medical care.

## 2023-08-25 ENCOUNTER — Other Ambulatory Visit: Payer: Self-pay | Admitting: Internal Medicine

## 2023-08-25 ENCOUNTER — Other Ambulatory Visit: Payer: Self-pay

## 2023-08-25 ENCOUNTER — Other Ambulatory Visit (HOSPITAL_COMMUNITY): Payer: Self-pay

## 2023-08-25 DIAGNOSIS — Z79899 Other long term (current) drug therapy: Secondary | ICD-10-CM

## 2023-08-25 DIAGNOSIS — L409 Psoriasis, unspecified: Secondary | ICD-10-CM

## 2023-08-25 DIAGNOSIS — L4050 Arthropathic psoriasis, unspecified: Secondary | ICD-10-CM

## 2023-08-25 NOTE — Progress Notes (Signed)
Specialty Pharmacy Refill Coordination Note  Brady Stephens is a 47 y.o. male contacted today regarding refills of specialty medication(s) Adalimumab (Humira (2 Pen))   Patient requested Delivery   Delivery date: 09/01/23   Verified address: [address] [place]   Medication will be filled on 08/31/23.

## 2023-08-27 ENCOUNTER — Other Ambulatory Visit (HOSPITAL_COMMUNITY): Payer: Self-pay

## 2023-08-27 ENCOUNTER — Other Ambulatory Visit: Payer: Self-pay

## 2023-08-27 MED ORDER — HUMIRA (2 PEN) 40 MG/0.4ML ~~LOC~~ AJKT
40.0000 mg | AUTO-INJECTOR | SUBCUTANEOUS | 2 refills | Status: DC
  Filled 2023-08-27: qty 2, 28d supply, fill #0
  Filled 2023-09-23: qty 2, 28d supply, fill #1
  Filled 2023-10-29: qty 2, 28d supply, fill #2

## 2023-08-27 NOTE — Telephone Encounter (Signed)
Last Fill: 05/26/2023  Labs: 07/08/2023 CBC WNL. Glucose is 129. Rest of CMP WNL.   TB Gold: 04/15/2023 Negative   Next Visit: 09/07/2023  Last Visit: 07/08/2023  GL:OVFIEPPIR arthropathy   Current Dose per office note 07/08/2023: Humira 40 mg sq injections once every 14 days.   Okay to refill Humira?

## 2023-08-31 ENCOUNTER — Other Ambulatory Visit: Payer: Self-pay

## 2023-09-01 ENCOUNTER — Other Ambulatory Visit: Payer: Self-pay | Admitting: Nurse Practitioner

## 2023-09-01 ENCOUNTER — Other Ambulatory Visit: Payer: Self-pay

## 2023-09-01 DIAGNOSIS — G8929 Other chronic pain: Secondary | ICD-10-CM

## 2023-09-01 DIAGNOSIS — E1165 Type 2 diabetes mellitus with hyperglycemia: Secondary | ICD-10-CM

## 2023-09-01 MED ORDER — TECHLITE PEN NEEDLES 31G X 8 MM MISC
0 refills | Status: DC
Start: 2023-09-01 — End: 2023-12-01
  Filled 2023-09-01: qty 100, 100d supply, fill #0

## 2023-09-01 MED ORDER — METHOCARBAMOL 750 MG PO TABS
750.0000 mg | ORAL_TABLET | Freq: Three times a day (TID) | ORAL | 0 refills | Status: DC
  Filled 2023-09-01: qty 90, 30d supply, fill #0

## 2023-09-03 ENCOUNTER — Other Ambulatory Visit: Payer: Self-pay

## 2023-09-07 ENCOUNTER — Telehealth: Payer: Self-pay | Admitting: Pharmacist

## 2023-09-07 ENCOUNTER — Ambulatory Visit: Payer: Medicaid Other | Attending: Physician Assistant | Admitting: Physician Assistant

## 2023-09-07 ENCOUNTER — Encounter: Payer: Self-pay | Admitting: Physician Assistant

## 2023-09-07 VITALS — BP 122/79 | HR 93 | Resp 14 | Ht 68.0 in | Wt 216.0 lb

## 2023-09-07 DIAGNOSIS — L4050 Arthropathic psoriasis, unspecified: Secondary | ICD-10-CM

## 2023-09-07 DIAGNOSIS — M19041 Primary osteoarthritis, right hand: Secondary | ICD-10-CM

## 2023-09-07 DIAGNOSIS — M5442 Lumbago with sciatica, left side: Secondary | ICD-10-CM

## 2023-09-07 DIAGNOSIS — E1165 Type 2 diabetes mellitus with hyperglycemia: Secondary | ICD-10-CM

## 2023-09-07 DIAGNOSIS — L409 Psoriasis, unspecified: Secondary | ICD-10-CM

## 2023-09-07 DIAGNOSIS — R5383 Other fatigue: Secondary | ICD-10-CM

## 2023-09-07 DIAGNOSIS — M5441 Lumbago with sciatica, right side: Secondary | ICD-10-CM

## 2023-09-07 DIAGNOSIS — Z79899 Other long term (current) drug therapy: Secondary | ICD-10-CM | POA: Diagnosis not present

## 2023-09-07 DIAGNOSIS — M19042 Primary osteoarthritis, left hand: Secondary | ICD-10-CM

## 2023-09-07 DIAGNOSIS — M533 Sacrococcygeal disorders, not elsewhere classified: Secondary | ICD-10-CM

## 2023-09-07 DIAGNOSIS — G8929 Other chronic pain: Secondary | ICD-10-CM

## 2023-09-07 DIAGNOSIS — Z794 Long term (current) use of insulin: Secondary | ICD-10-CM

## 2023-09-07 DIAGNOSIS — E782 Mixed hyperlipidemia: Secondary | ICD-10-CM

## 2023-09-07 NOTE — Progress Notes (Signed)
 Pharmacy Note  Subjective: Patient presents today to [facility] for follow up office visit. Patient seen by the pharmacist for counseling on methotrexate  for psoriatic arthritis and plaque psoriasis. Current therapy includes:Humira  (approx 30% improvement)  Objective: CBC    Component Value Date/Time   WBC 7.0 07/08/2023 0809   RBC 5.10 07/08/2023 0809   HGB 15.8 07/08/2023 0809   HGB 16.5 04/14/2023 1614   HCT 46.9 07/08/2023 0809   HCT 47.9 04/14/2023 1614   PLT 252 07/08/2023 0809   PLT 297 04/14/2023 1614   MCV 92.0 07/08/2023 0809   MCV 89 04/14/2023 1614   MCH 31.0 07/08/2023 0809   MCHC 33.7 07/08/2023 0809   RDW 13.7 07/08/2023 0809   RDW 13.6 04/14/2023 1614   LYMPHSABS 2,442 04/15/2023 0952   LYMPHSABS 3.0 04/14/2023 1614   MONOABS 0.4 05/11/2008 1000   EOSABS 126 07/08/2023 0809   EOSABS 0.1 04/14/2023 1614   BASOSABS 63 07/08/2023 0809   BASOSABS 0.1 04/14/2023 1614    CMP     Component Value Date/Time   NA 140 07/08/2023 0809   NA 139 04/14/2023 1614   K 4.1 07/08/2023 0809   CL 102 07/08/2023 0809   CO2 29 07/08/2023 0809   GLUCOSE 129 (H) 07/08/2023 0809   BUN 14 07/08/2023 0809   BUN 10 04/14/2023 1614   CREATININE 0.92 07/08/2023 0809   CALCIUM  9.3 07/08/2023 0809   PROT 7.2 07/08/2023 0809   PROT 7.2 04/14/2023 1614   ALBUMIN 4.3 04/14/2023 1614   AST 15 07/08/2023 0809   ALT 22 07/08/2023 0809   ALKPHOS 83 04/14/2023 1614   BILITOT 0.4 07/08/2023 0809   BILITOT 0.4 04/14/2023 1614   GFRNONAA >60 12/17/2021 0047    Baseline Immunosuppressant Therapy Labs TB GOLD    Latest Ref Rng & Units 04/15/2023    9:52 AM  Quantiferon TB Gold  Quantiferon TB Gold Plus NEGATIVE NEGATIVE    Hepatitis Panel    Latest Ref Rng & Units 04/15/2023    9:52 AM  Hepatitis  Hep B Surface Ag NON-REACTIVE NON-REACTIVE   Hep B IgM NON-REACTIVE NON-REACTIVE   Hep C Ab NON-REACTIVE NON-REACTIVE    HIV Lab Results  Component Value Date   HIV  Non Reactive 12/17/2021   Immunoglobulins    Latest Ref Rng & Units 04/15/2023    9:52 AM  Immunoglobulin Electrophoresis  IgA  47 - 310 mg/dL 735   IgG 399 - 8,359 mg/dL 8,476   IgM 50 - 699 mg/dL 63    SPEP    Latest Ref Rng & Units 07/08/2023    8:09 AM  Serum Protein Electrophoresis  Total Protein 6.1 - 8.1 g/dL 7.2    Chest-xray:  0/03/7974 - Negative chest.  Contraception: patient has been advised to use contraception with sexual partners who cannot become pregnant  Alcohol use: once yearly  Assessment/Plan:   Patient was counseled on the purpose, proper use, and adverse effects of methotrexate  including nausea, infection, and signs and symptoms of pneumonitis. Discussed that there is the possibility of an increased risk of malignancy, specifically lymphomas, but it is not well understood if this increased risk is due to the medication or the disease state.  Instructed patient that medication should be held for infection and prior to surgery.  Advised patient to avoid live vaccines. Recommend annual influenza, Pneumovax 23, Prevnar 13, and Shingrix as indicated.   Reviewed instructions with patient to take methotrexate  weekly along with folic acid   daily.  Discussed the importance of frequent monitoring of kidney and liver function and blood counts, and provided patient with standing lab instructions.  Counseled patient to avoid NSAIDs and alcohol while on methotrexate .  Provided patient with educational materials on methotrexate  and answered all questions.   Patient voiced understanding.  Patient consented to methotrexate  use.  Will upload into chart.    Educated patient on how to use a vial and syringe and reviewed injection technique with patient.  Patient was able to demonstrate proper technique for injections using vial and syringe.  Provided patient educational material regarding injection technique and storage of methotrexate .    Dose of methotrexate  will be 0.6mL (15mg ) SQ  weekly x 2 weeks then repeat CBC/CMP. If stable, increase to MTX 0.8mL (20mg ) SQ weekly thereafter. Repeat CBC/CMP after 2 weeks then every 3 months. Take folic acid  2mg  daily. Prescription pending baseline labs.   Will continue Humira  40mg  SQ every 2 weeks  Sherry Pennant, PharmD, MPH, BCPS, CPP Clinical Pharmacist (Rheumatology and Pulmonology)

## 2023-09-07 NOTE — Telephone Encounter (Signed)
 Pending baseline CBC/CMP today, patient is starting MTX vial/syringe.  Dose of methotrexate  will be 0.6mL (15mg ) SQ weekly x 2 weeks then repeat CBC/CMP. If stable, increase to MTX 0.8mL (20mg ) SQ weekly thereafter. Repeat CBC/CMP after 2 weeks then every 3 months. Take folic acid  2mg  daily. Prescription pending baseline labs.   Will continue Humira  40mg  SQ every 2 weeks

## 2023-09-07 NOTE — Patient Instructions (Addendum)
 Standing Labs We placed an order today for your standing lab work.   Please have your standing labs drawn in 2 weeks x2, 2 months, and 3 months   Please have your labs drawn 2 weeks prior to your appointment so that the provider can discuss your lab results at your appointment, if possible.  Please note that you may see your imaging and lab results in MyChart before we have reviewed them. We will contact you once all results are reviewed. Please allow our office up to 72 hours to thoroughly review all of the results before contacting the office for clarification of your results.  WALK-IN LAB HOURS  Monday through Thursday from 8:00 am -12:30 pm and 1:00 pm-5:00 pm and Friday from 8:00 am-12:00 pm.  Patients with office visits requiring labs will be seen before walk-in labs.  You may encounter longer than normal wait times. Please allow additional time. Wait times may be shorter on  Monday and Thursday afternoons.  We do not book appointments for walk-in labs. We appreciate your patience and understanding with our staff.   Labs are drawn by [facility]. Please bring your co-pay at the time of your lab draw.  You may receive a bill from [facility] for your lab work.  Please note if you are on Hydroxychloroquine and and an order has been placed for a Hydroxychloroquine level,  you will need to have it drawn 4 hours or more after your last dose.  If you wish to have your labs drawn at another location, please call the office 24 hours in advance so we can fax the orders.  The office is located at [address], [place], [place] [address]   If you have any questions regarding directions or hours of operation,  please call [phone].   As a reminder, please drink plenty of water prior to coming for your lab work. Thanks!   Methotrexate  Subcutaneous Injection What is this medication? METHOTREXATE  (METH oh TREX ate) treats inflammatory conditions such as arthritis or psoriasis. It works  by decreasing inflammation, which can reduce pain and prevent long-term injury to the joints and skin. This medicine may be used for other purposes; ask your health care provider or pharmacist if you have questions. COMMON BRAND NAME(S): Otrexup , Rasuvo , RediTrex  What should I tell my care team before I take this medication? They need to know if you have any of these conditions: Fluid in the stomach area or lungs If you often drink alcohol Infection or immune system problems Kidney disease Liver disease Low blood counts, like low white cell, platelet, or red cell counts Lung disease Radiation therapy Stomach ulcers Ulcerative colitis An unusual or allergic reaction to methotrexate , other medications, foods, dyes, or preservatives Pregnant or trying to get pregnant Breast-feeding How should I use this medication? This medication is for injection under the skin. You will be taught how to prepare and give this medication. Refer to the Instructions for Use that come with your medication packaging. Use exactly as directed. Take your medication at regular intervals. Do not take your medication more often than directed. This medication should be taken weekly, NOT daily. It is important that you put your used needles and syringes in a special sharps container. Do not put them in a trash can. If you do not have a sharps container, call your pharmacist or care team to get one. Talk to your care team about the use of this medication in children. While this medication may be prescribed  for children as young as 2 years for selected conditions, precautions do apply. Overdosage: If you think you have taken too much of this medicine contact a poison control center or emergency room at once. NOTE: This medicine is only for you. Do not share this medicine with others. What if I miss a dose? If you are not sure if this medication was injected or if you have a hard time giving the injection, do not inject  another dose. Talk with your care team. What may interact with this medication? Do not take this medication with any of the following: Acitretin This medication may also interact with the following: Aspirin or aspirin-like medications including salicylates Azathioprine Certain antibiotics like chloramphenicol, penicillin, tetracycline Certain medications that treat or prevent blood clots like warfarin, apixaban, dabigatran, and rivaroxaban Certain medications for stomach problems like esomeprazole, omeprazole, pantoprazole Cyclosporine Dapsone Diuretics Gold Hydroxychloroquine Live virus vaccines Medications for infection like acyclovir, adefovir, amphotericin B, bacitracin, cidofovir, foscarnet, ganciclovir, gentamicin, pentamidine, vancomycin Mercaptopurine NSAIDs, medications for pain and inflammation, like ibuprofen or naproxen Other cytotoxic agents Pamidronate Pemetrexed Penicillamine Phenylbutazone Phenytoin Probenecid Retinoids such as isotretinoin and tretinoin Steroid medications like prednisone or cortisone Sulfonamides like sulfasalazine and trimethoprim/sulfamethoxazole Theophylline Zoledronic acid This list may not describe all possible interactions. Give your health care provider a list of all the medicines, herbs, non-prescription drugs, or dietary supplements you use. Also tell them if you smoke, drink alcohol, or use illegal drugs. Some items may interact with your medicine. What should I watch for while using this medication? Avoid alcoholic drinks. This medication can make you more sensitive to the sun. Keep out of the sun. If you cannot avoid being in the sun, wear protective clothing and use sunscreen. Do not use sun lamps or tanning beds/booths. You may get drowsy or dizzy. Do not drive, use machinery, or do anything that needs mental alertness until you know how this medication affects you. Do not stand or sit up quickly, especially if you are an older  patient. This reduces the risk of dizzy or fainting spells. You may need blood work done while you are taking this medication. Call your care team for advice if you get a fever, chills or sore throat, or other symptoms of a cold or flu. Do not treat yourself. This medication decreases your body's ability to fight infections. Try to avoid being around people who are sick. This medication may increase your risk to bruise or bleed. Call your care team if you notice any unusual bleeding. Be careful brushing or flossing your teeth or using a toothpick because you may get an infection or bleed more easily. If you have any dental work done, tell your dentist you are receiving this medication Check with your care team if you get an attack of severe diarrhea, nausea and vomiting, or if you sweat a lot. The loss of too much body fluid can make it dangerous for you to take this medication. Talk to your care team about your risk of cancer. You may be more at risk for certain types of cancers if you take this medication. Do not become pregnant while taking this medication or for 6 months after stopping it. Women should inform their care team if they wish to become pregnant or think they might be pregnant. Men should not father a child while taking this medication and for 3 months after stopping it. There is potential for serious harm to an unborn child. Talk to your care team for more  information. Do not breast-feed an infant while taking this medication or for 1 week after stopping it. This medication may make it more difficult to get pregnant or father a child. Talk to your care team if you are concerned about your fertility. What side effects may I notice from receiving this medication? Side effects that you should report to your care team as soon as possible: Allergic reactions--skin rash, itching, hives, swelling of the face, lips, tongue, or throat Blood clot--pain, swelling, or warmth in the leg, shortness of  breath, chest pain Dry cough, shortness of breath or trouble breathing Infection--fever, chills, cough, sore throat, wounds that don't heal, pain or trouble when passing urine, general feeling of discomfort or being unwell Kidney injury--decrease in the amount of urine, swelling of the ankles, hands, or feet Liver injury--right upper belly pain, loss of appetite, nausea, light-colored stool, dark yellow or brown urine, yellowing of the skin or eyes, unusual weakness or fatigue Low red blood cell count--unusual weakness or fatigue, dizziness, headache, trouble breathing Redness, blistering, peeling, or loosening of the skin, including inside the mouth Seizures Unusual bruising or bleeding Side effects that usually do not require medical attention (report to your care team if they continue or are bothersome): Diarrhea Dizziness Hair loss Nausea Pain, redness, or swelling with sores inside the mouth or throat Vomiting This list may not describe all possible side effects. Call your doctor for medical advice about side effects. You may report side effects to FDA at 1-800-FDA-1088. Where should I keep my medication? Keep out of the reach of children and pets. Store at room temperature between 20 and 25 degrees C (68 and 77 degrees F). Protect from light. Get rid of any unused medication after the expiration date. Talk to your care team about how to dispose of unused medicine. Special directions may apply. NOTE: This sheet is a summary. It may not cover all possible information. If you have questions about this medicine, talk to your doctor, pharmacist, or health care provider.  2024 Elsevier/Gold Standard (2021-01-17 00:00:00)

## 2023-09-08 ENCOUNTER — Other Ambulatory Visit: Payer: Self-pay

## 2023-09-08 LAB — CBC WITH DIFFERENTIAL/PLATELET
Absolute Lymphocytes: 3408 {cells}/uL (ref 850–3900)
Absolute Monocytes: 512 {cells}/uL (ref 200–950)
Basophils Absolute: 64 {cells}/uL (ref 0–200)
Basophils Relative: 0.8 %
Eosinophils Absolute: 160 {cells}/uL (ref 15–500)
Eosinophils Relative: 2 %
HCT: 48.9 % (ref 38.5–50.0)
Hemoglobin: 16.9 g/dL (ref 13.2–17.1)
MCH: 31.4 pg (ref 27.0–33.0)
MCHC: 34.6 g/dL (ref 32.0–36.0)
MCV: 90.9 fL (ref 80.0–100.0)
MPV: 10.4 fL (ref 7.5–12.5)
Monocytes Relative: 6.4 %
Neutro Abs: 3856 {cells}/uL (ref 1500–7800)
Neutrophils Relative %: 48.2 %
Platelets: 271 10*3/uL (ref 140–400)
RBC: 5.38 10*6/uL (ref 4.20–5.80)
RDW: 13.1 % (ref 11.0–15.0)
Total Lymphocyte: 42.6 %
WBC: 8 10*3/uL (ref 3.8–10.8)

## 2023-09-08 LAB — COMPLETE METABOLIC PANEL WITH GFR
AG Ratio: 1.4 (calc) (ref 1.0–2.5)
ALT: 19 U/L (ref 9–46)
AST: 15 U/L (ref 10–40)
Albumin: 4.3 g/dL (ref 3.6–5.1)
Alkaline phosphatase (APISO): 79 U/L (ref 36–130)
BUN: 9 mg/dL (ref 7–25)
CO2: 28 mmol/L (ref 20–32)
Calcium: 9.5 mg/dL (ref 8.6–10.3)
Chloride: 102 mmol/L (ref 98–110)
Creat: 0.99 mg/dL (ref 0.60–1.29)
Globulin: 3 g/dL (ref 1.9–3.7)
Glucose, Bld: 151 mg/dL — ABNORMAL HIGH (ref 65–99)
Potassium: 4.3 mmol/L (ref 3.5–5.3)
Sodium: 140 mmol/L (ref 135–146)
Total Bilirubin: 0.5 mg/dL (ref 0.2–1.2)
Total Protein: 7.3 g/dL (ref 6.1–8.1)
eGFR: 93 mL/min/{1.73_m2} (ref >=60)

## 2023-09-08 MED ORDER — FOLIC ACID 1 MG PO TABS
2.0000 mg | ORAL_TABLET | Freq: Every day | ORAL | 3 refills | Status: DC
  Filled 2023-09-08: qty 180, 90d supply, fill #0

## 2023-09-08 MED ORDER — METHOTREXATE SODIUM CHEMO INJECTION 50 MG/2ML
INTRAMUSCULAR | 0 refills | Status: AC
  Filled 2023-09-08: qty 4, 28d supply, fill #0

## 2023-09-08 NOTE — Telephone Encounter (Signed)
 Glucose is elevated-151. Rest of CMP WNL. CBC WNL.   Ok to initiate injectable methotrexate as discussed at appt yesterday.

## 2023-09-08 NOTE — Progress Notes (Signed)
 Glucose is elevated-151. Rest of CMP WNL. CBC WNL.   Ok to initiate injectable methotrexate as discussed at appt yesterday.

## 2023-09-14 ENCOUNTER — Ambulatory Visit: Payer: Medicaid Other | Attending: Nurse Practitioner | Admitting: Nurse Practitioner

## 2023-09-14 ENCOUNTER — Other Ambulatory Visit: Payer: Self-pay

## 2023-09-14 ENCOUNTER — Encounter: Payer: Self-pay | Admitting: Nurse Practitioner

## 2023-09-14 VITALS — BP 114/71 | HR 88 | Resp 20 | Ht 68.0 in | Wt 214.6 lb

## 2023-09-14 DIAGNOSIS — E785 Hyperlipidemia, unspecified: Secondary | ICD-10-CM | POA: Diagnosis not present

## 2023-09-14 DIAGNOSIS — Z794 Long term (current) use of insulin: Secondary | ICD-10-CM | POA: Diagnosis not present

## 2023-09-14 DIAGNOSIS — E1165 Type 2 diabetes mellitus with hyperglycemia: Secondary | ICD-10-CM | POA: Diagnosis not present

## 2023-09-14 LAB — POCT GLYCOSYLATED HEMOGLOBIN (HGB A1C): Hemoglobin A1C: 8.2 % — AB (ref 4.0–5.6)

## 2023-09-14 MED ORDER — LANTUS SOLOSTAR 100 UNIT/ML ~~LOC~~ SOPN
25.0000 [IU] | PEN_INJECTOR | Freq: Every day | SUBCUTANEOUS | 3 refills | Status: DC
  Filled 2023-09-14: qty 15, 60d supply, fill #0
  Filled 2023-11-08: qty 15, 60d supply, fill #1
  Filled 2024-01-11: qty 15, 60d supply, fill #2

## 2023-09-14 NOTE — Patient Instructions (Signed)
 Increase lantus to 25 units daily  Send me your glucose readings fasting and evening in 2 weeks  F/u with me in 3 months

## 2023-09-14 NOTE — Progress Notes (Signed)
 Assessment & Plan:  Brady Stephens was seen today for medical management of chronic issues.  Diagnoses and all orders for this visit:  Uncontrolled type 2 diabetes mellitus with hyperglycemia, with long-term current use of insulin   DOSE CHANGE> Lantus  20 to now 25 units daily -     POCT glycosylated hemoglobin (Hb A1C) -     Ambulatory referral to Ophthalmology -     insulin  glargine (LANTUS  SOLOSTAR) 100 UNIT/ML Solostar Pen; Inject 25 Units into the skin daily.  Dyslipidemia, goal LDL below 70 -     Lipid panel INSTRUCTIONS: Work on a low fat, heart healthy diet and participate in regular aerobic exercise program by working out at least 150 minutes per week; 5 days a week-30 minutes per day. Avoid red meat/beef/steak,  fried foods. junk foods, sodas, sugary drinks, unhealthy snacking, alcohol and smoking.  Drink at least 80 oz of water per day and monitor your carbohydrate intake daily.     Patient has been counseled on age-appropriate routine health concerns for screening and prevention. These are reviewed and up-to-date. Referrals have been placed accordingly. Immunizations are up-to-date or declined.    Subjective:   Chief Complaint  Patient presents with   Medical Management of Chronic Issues    Brady Stephens 48 y.o. male presents to office today for follow up to DM  He is currently being followed by Rheumatology for psoriatic arthritis Taking humira  and recently prescribed methotrexate   He has a past medical history of Carpal tunnel syndrome, bilateral, HPL and DM type 2, not at goal.   DM 2 Diabetes is poorly controlled A1c up to 8.2 from 6.8. He states blood sugars in the  am 90-130s. He is monitoring his blood glucose levels fasting in the am and in the evening around 6-7pm. Taking lantus  20 units in the am.  Lab Results  Component Value Date   HGBA1C 8.2 (A) 09/14/2023  LDL not at goal with pravastatin  20 mg daily.  Lab Results  Component Value Date   LDLCALC 211 (H)  08/05/2022        Review of Systems  Constitutional:  Negative for fever, malaise/fatigue and weight loss.  HENT: Negative.  Negative for nosebleeds.   Eyes: Negative.  Negative for blurred vision, double vision and photophobia.  Respiratory: Negative.  Negative for cough and shortness of breath.   Cardiovascular: Negative.  Negative for chest pain, palpitations and leg swelling.  Gastrointestinal: Negative.  Negative for heartburn, nausea and vomiting.  Musculoskeletal:  Positive for joint pain. Negative for myalgias.  Neurological: Negative.  Negative for dizziness, focal weakness, seizures and headaches.  Psychiatric/Behavioral: Negative.  Negative for suicidal ideas.     Past Medical History:  Diagnosis Date   Carpal tunnel syndrome, bilateral    DM type 2, not at goal [facility])     Past Surgical History:  Procedure Laterality Date   EYE SURGERY     6 months old   HERNIA REPAIR     as child   SPINAL CORD STIMULATOR TRIAL      Family History  Problem Relation Age of Onset   Arthritis Mother    Gout Maternal Grandfather    Healthy Son    Healthy Son    Eczema Son    Healthy Daughter    Eczema Daughter    Healthy Daughter    Healthy Daughter     Social History Reviewed with no changes to be made today.   Outpatient Medications Prior to Visit  Medication Sig Dispense Refill   Accu-Chek Softclix Lancets lancets Check blood glucose level by fingerstick two times per day. 100 each 2   adalimumab  (HUMIRA , 2 PEN,) 40 MG/0.4ML pen Inject 0.4 mLs (40 mg total) into the skin every 14 (fourteen) days. 2 each 2   BLACK CURRANT SEED OIL PO Take by mouth daily.     Blood Glucose Monitoring Suppl (ACCU-CHEK GUIDE) w/Device KIT Check blood glucose level by fingerstick two times per day. 1 kit 0   ELDERBERRY PO Take by mouth.     folic acid  (FOLVITE ) 1 MG tablet Take 2 tablets (2 mg total) by mouth daily. 180 tablet 3   glucose blood (ACCU-CHEK GUIDE) test strip Check blood  glucose level by fingerstick two times per day. 100 each 12   Insulin  Pen Needle (TECHLITE PEN NEEDLES) 31G X 8 MM MISC Use as instructed. Inject into the skin once daily 100 each 0   Menthol, Topical Analgesic, (BIOFREEZE EX) Apply topically daily as needed.     methocarbamol  (ROBAXIN ) 750 MG tablet Take 1 tablet (750 mg total) by mouth 3 (three) times daily. For muscle spasms 90 tablet 0   methotrexate  50 MG/2ML injection Inject 0.6 mL under the skin weekly for two weeks, if labs are stable then inject 0.8 mL under the skin weekly for two weeks 4 mL 0   Naphazoline-Pheniramine (VISINE-A OP) Place 1 drop into both eyes 2 (two) times daily as needed (dry itchy eyes).     nystatin  (MYCOSTATIN ) 100000 UNIT/ML suspension Take 5 mLs (500,000 Units total) by mouth 4 (four) times daily. 60 mL 1   OVER THE COUNTER MEDICATION SEA MOSS     OVER THE COUNTER MEDICATION Soursop     pravastatin  (PRAVACHOL ) 20 MG tablet Take 1 tablet (20 mg total) by mouth daily. 90 tablet 3   insulin  glargine (LANTUS  SOLOSTAR) 100 UNIT/ML Solostar Pen Inject 20 Units into the skin daily. 15 mL 1   meloxicam  (MOBIC ) 15 MG tablet Take 1 tablet (15 mg total) by mouth daily for back pain (Patient not taking: Reported on 09/14/2023) 30 tablet 0   No facility-administered medications prior to visit.    No Known Allergies     Objective:    BP 114/71 (BP Location: Left Arm, Patient Position: Sitting, Cuff Size: Large)   Pulse 88   Resp 20   Ht 5' 8 (1.727 m)   Wt 214 lb 9.6 oz (97.3 kg)   SpO2 98%   BMI 32.63 kg/m  Wt Readings from Last 3 Encounters:  09/14/23 214 lb 9.6 oz (97.3 kg)  09/07/23 216 lb (98 kg)  07/08/23 213 lb 12.8 oz (97 kg)    Physical Exam Vitals and nursing note reviewed.  Constitutional:      Appearance: He is well-developed.  HENT:     Head: Normocephalic and atraumatic.  Cardiovascular:     Rate and Rhythm: Normal rate and regular rhythm.     Heart sounds: Normal heart sounds. No murmur  heard.    No friction rub. No gallop.  Pulmonary:     Effort: Pulmonary effort is normal. No tachypnea or respiratory distress.     Breath sounds: Normal breath sounds. No decreased breath sounds, wheezing, rhonchi or rales.  Chest:     Chest wall: No tenderness.  Abdominal:     General: Bowel sounds are normal.     Palpations: Abdomen is soft.  Musculoskeletal:        General: Normal range of  motion.     Cervical back: Normal range of motion.  Skin:    General: Skin is warm and dry.  Neurological:     Mental Status: He is alert and oriented to person, place, and time.     Coordination: Coordination normal.  Psychiatric:        Behavior: Behavior normal. Behavior is cooperative.        Thought Content: Thought content normal.        Judgment: Judgment normal.          Patient has been counseled extensively about nutrition and exercise as well as the importance of adherence with medications and regular follow-up. The patient was given clear instructions to go to ER or return to medical center if symptoms don't improve, worsen or new problems develop. The patient verbalized understanding.   Follow-up: Return in about 3 months (around 12/18/2023).   Haze LELON Servant, FNP-BC [facility] [place], [place] [phone]   09/14/2023, 1:26 PM

## 2023-09-15 LAB — LIPID PANEL
Chol/HDL Ratio: 8.4 {ratio} — ABNORMAL HIGH (ref 0.0–5.0)
Cholesterol, Total: 259 mg/dL — ABNORMAL HIGH (ref 100–199)
HDL: 31 mg/dL — ABNORMAL LOW (ref >39)
LDL Chol Calc (NIH): 183 mg/dL — ABNORMAL HIGH (ref 0–99)
Triglycerides: 234 mg/dL — ABNORMAL HIGH (ref 0–149)
VLDL Cholesterol Cal: 45 mg/dL — ABNORMAL HIGH (ref 5–40)

## 2023-09-22 ENCOUNTER — Encounter: Payer: Self-pay | Admitting: Nurse Practitioner

## 2023-09-22 ENCOUNTER — Other Ambulatory Visit: Payer: Self-pay

## 2023-09-22 ENCOUNTER — Other Ambulatory Visit: Payer: Self-pay | Admitting: Nurse Practitioner

## 2023-09-22 DIAGNOSIS — E785 Hyperlipidemia, unspecified: Secondary | ICD-10-CM

## 2023-09-22 MED ORDER — PRAVASTATIN SODIUM 40 MG PO TABS
40.0000 mg | ORAL_TABLET | Freq: Every day | ORAL | 3 refills | Status: DC
  Filled 2023-09-22: qty 90, 90d supply, fill #0

## 2023-09-23 ENCOUNTER — Other Ambulatory Visit (HOSPITAL_COMMUNITY): Payer: Self-pay

## 2023-09-23 ENCOUNTER — Other Ambulatory Visit (HOSPITAL_COMMUNITY): Payer: Self-pay | Admitting: Pharmacy Technician

## 2023-09-23 NOTE — Progress Notes (Signed)
Specialty Pharmacy Refill Coordination Note  Brady Stephens is a 48 y.o. male contacted today regarding refills of specialty medication(s) Adalimumab (Humira (2 Pen))   Patient requested Delivery   Delivery date: 10/06/23   Verified address: [address] [place]   Medication will be filled on 10/05/23.

## 2023-09-25 ENCOUNTER — Other Ambulatory Visit: Payer: Self-pay | Admitting: Nurse Practitioner

## 2023-09-25 ENCOUNTER — Other Ambulatory Visit: Payer: Self-pay

## 2023-09-25 DIAGNOSIS — Z789 Other specified health status: Secondary | ICD-10-CM

## 2023-09-25 MED ORDER — EZETIMIBE 10 MG PO TABS
10.0000 mg | ORAL_TABLET | Freq: Every day | ORAL | 3 refills | Status: AC
  Filled 2023-09-25: qty 90, 90d supply, fill #0

## 2023-10-01 ENCOUNTER — Other Ambulatory Visit: Payer: Self-pay

## 2023-10-05 ENCOUNTER — Other Ambulatory Visit: Payer: Self-pay

## 2023-10-21 ENCOUNTER — Ambulatory Visit: Payer: Medicaid Other | Admitting: Dermatology

## 2023-10-27 NOTE — Progress Notes (Signed)
 Office Visit Note  Patient: Brady Stephens             Date of Birth: 12/24/1975           MRN: [id number]             PCP: Claiborne Rigg, NP Referring: Claiborne Rigg, NP Visit Date: 11/10/2023 Occupation: @GUAROCC @  Subjective:  Medication monitoring   History of Present Illness: Brady Stephens is a 48 y.o. male with history of psoriatic arthritis.  Patient remains on  Humira 40 mg sq injections once every 14 days--started 05/26/23.  He is tolerating Humira without any side effects and has not had any injection site reactions.  He denies any gaps in therapy.  Patient reports that he has noticed improvement since his last office visit so he is decided against initiating methotrexate.  Patient states that he has less severe pain but continues to have intermittent sharp shooting pains particularly in his hands and feet.  He continues to have chronic pain in his lower back.  He has not noticed any improvement in his lower back pain but is considering proceeding with a nerve ablation in the future.  Patient reports that overall he has noticed about a 35% improvement in his symptoms.  He denies any skin or nail changes.  He denies any recent or recurrent infections.     Activities of Daily Living:  Patient reports morning stiffness for 30-45 minutes.   Patient Reports nocturnal pain.  Difficulty dressing/grooming: Reports Difficulty climbing stairs: Reports Difficulty getting out of chair: Reports Difficulty using hands for taps, buttons, cutlery, and/or writing: Reports  Review of Systems  Constitutional:  Positive for fatigue.  HENT:  Positive for mouth dryness. Negative for mouth sores and nose dryness.   Eyes:  Positive for dryness. Negative for pain.  Respiratory:  Negative for shortness of breath and difficulty breathing.   Cardiovascular:  Negative for chest pain and palpitations.  Gastrointestinal:  Negative for blood in stool, constipation and diarrhea.  Endocrine: Negative  for increased urination.  Genitourinary:  Negative for involuntary urination.  Musculoskeletal:  Positive for joint pain, gait problem, joint pain and morning stiffness. Negative for joint swelling, myalgias, muscle weakness, muscle tenderness and myalgias.  Skin:  Negative for color change, rash and sensitivity to sunlight.  Allergic/Immunologic: Positive for susceptible to infections.  Neurological:  Positive for headaches. Negative for dizziness.  Hematological:  Negative for swollen glands.  Psychiatric/Behavioral:  Positive for sleep disturbance. Negative for depressed mood. The patient is not nervous/anxious.     PMFS History:  Patient Active Problem List   Diagnosis Date Noted   Uncontrolled type 2 diabetes mellitus with hyperglycemia, with long-term current use of insulin (HCC) 12/16/2021    Past Medical History:  Diagnosis Date   Carpal tunnel syndrome, bilateral    DM type 2, not at goal [facility])     Family History  Problem Relation Age of Onset   Arthritis Mother    Gout Maternal Grandfather    Healthy Son    Healthy Son    Eczema Son    Healthy Daughter    Eczema Daughter    Healthy Daughter    Healthy Daughter    Past Surgical History:  Procedure Laterality Date   EYE SURGERY     6 months old   HERNIA REPAIR     as child   SPINAL CORD STIMULATOR TRIAL     Social History   Social History Narrative  Not on file   There is no immunization history for the selected administration types on file for this patient.   Objective: Vital Signs: BP 117/79 (BP Location: Left Arm, Patient Position: Sitting, Cuff Size: Large)   Pulse 96   Resp 15   Ht 5\' 8"  (1.727 m)   Wt 213 lb 12.8 oz (97 kg)   BMI 32.51 kg/m    Physical Exam Vitals and nursing note reviewed.  Constitutional:      Appearance: He is well-developed.  HENT:     Head: Normocephalic and atraumatic.  Eyes:     Conjunctiva/sclera: Conjunctivae normal.     Pupils: Pupils are equal, round, and  reactive to light.  Cardiovascular:     Rate and Rhythm: Normal rate and regular rhythm.     Heart sounds: Normal heart sounds.  Pulmonary:     Effort: Pulmonary effort is normal.     Breath sounds: Normal breath sounds.  Abdominal:     General: Bowel sounds are normal.     Palpations: Abdomen is soft.  Musculoskeletal:     Cervical back: Normal range of motion and neck supple.  Skin:    General: Skin is warm and dry.     Capillary Refill: Capillary refill takes less than 2 seconds.  Neurological:     Mental Status: He is alert and oriented to person, place, and time.  Psychiatric:        Behavior: Behavior normal.      Musculoskeletal Exam: C-spine has good range of motion.  Limited mobility of the lumbar spine.  No midline spinal tenderness or SI joint tenderness upon palpation.  Shoulder joints, elbow joints, wrist joints, MCPs, PIPs, DIPs have good range of motion with no synovitis.  Tenderness of the right first PIP joint and the DIP joint of the right index finger noted.  Hip joints have good range of motion.  Knee joints have good range of motion no warmth or effusion.  Ankle joints have good range of motion with no joint tenderness.  Mild tenderness along the Achilles tendon of the left foot.  CDAI Exam: CDAI Score: -- Patient Global: --; Provider Global: -- Swollen: --; Tender: -- Joint Exam 11/10/2023   No joint exam has been documented for this visit   There is currently no information documented on the homunculus. Go to the Rheumatology activity and complete the homunculus joint exam.  Investigation: No additional findings.  Imaging: No results found.  Recent Labs: Lab Results  Component Value Date   WBC 8.0 09/07/2023   HGB 16.9 09/07/2023   PLT 271 09/07/2023   NA 140 09/07/2023   K 4.3 09/07/2023   CL 102 09/07/2023   CO2 28 09/07/2023   GLUCOSE 151 (H) 09/07/2023   BUN 9 09/07/2023   CREATININE 0.99 09/07/2023   BILITOT 0.5 09/07/2023   ALKPHOS 83  04/14/2023   AST 15 09/07/2023   ALT 19 09/07/2023   PROT 7.3 09/07/2023   ALBUMIN 4.3 04/14/2023   CALCIUM 9.5 09/07/2023   QFTBGOLDPLUS NEGATIVE 04/15/2023    Speciality Comments: No specialty comments available.  Procedures:  No procedures performed Allergies: Patient has no known allergies.   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  Nail dystrophy: Patient remains on Humira 40 mg subcu days injections every 14 days.  He is tolerating Humira without any side effects or injection site reactions.  He decided against initiating methotrexate after his last office visit due to wanting to give Humira more time.  He has continued to notice gradual improvement in his symptoms and overall states he has had a 35% improvement in his symptoms.  He has had less severe joint pain and has had less joint inflammation.  He continues to have persistent discomfort in his lower back and intermittent sharp pain in his hands and feet.  No synovitis or dactylitis was noted on examination today.  No SI joint tenderness was apparent upon palpation.  Patient would like to hold off on adding methotrexate at this time.  He would like to remain on Humira as monotherapy.  No medication changes will be made at this time.  He was advised to notify us if he develops any new or worsening symptoms.  He will follow-up in the office in 2 to 3 months or sooner if needed.  Psoriasis - Hyperpigmentation was noted on the scalp and in the ear canals.  No new or worsening skin or nail changes.  He will remain on Humira as prescribed.   High risk medication use - Humira 40 mg sq injections once every 14 days--started 05/26/23.CXR 04/28/23. Patient has decided against initiating methotrexate. CBC and CMP updated on 09/07/23.  His next lab work will be due in April and every 3 months to monitor for drug  toxicity. TB gold negative on 04/15/23.  No recent or recurrent infections.  Discussed the importance of holding humira if he develops signs or symptoms of an infection and to resume once the infection has completely cleared.   Primary osteoarthritis of both hands - XR suggestive of psoriatic arthritis and osteoarthritis overlap.  Severe arthritis and erosive changes noted on x-rays from 04/15/23.  He has been experiencing less severe pain in his hand but continues to have intermittent sharp shooting pains especially in the DIP joints.  No synovitis or dactylitis noted on examination today.  Chronic SI joint pain - Unremarkable XR of pelvis.  No erosive changes.  No SI joint tenderness upon palpation today.  Chronic pain.  Considering a nerve ablation.  No improvement with the use of Humira.  Chronic midline low back pain with bilateral sciatica: X-rays of the lumbar spine updated on 04/15/2023: Levoscoliosis mild spondylosis and facet joint arthropathy.  Pain.  He is considering proceeding with a nerve ablation in the future due to the chronicity and severity of his lower back pain.  He has not had any improvement in his lower back pain since initiating Humira.  Other medical conditions are listed as follows:   Other fatigue  Uncontrolled type 2 diabetes mellitus with hyperglycemia, with long-term current use of insulin (HCC)  Mixed hyperlipidemia  Orders: No orders of the defined types were placed in this encounter.  No orders of the defined types were placed in this encounter.    Follow-Up Instructions: Return in about 3 months (around 02/10/2024) for Psoriatic arthritis.   Gearldine Bienenstock, PA-C  Note - This record has been created using Dragon software.  Chart creation errors have been sought, but may not always  have been located. Such creation errors do not reflect on  the standard of medical care.

## 2023-10-29 ENCOUNTER — Other Ambulatory Visit (HOSPITAL_COMMUNITY): Payer: Self-pay

## 2023-10-29 ENCOUNTER — Other Ambulatory Visit: Payer: Self-pay

## 2023-10-29 NOTE — Progress Notes (Signed)
 Specialty Pharmacy Refill Coordination Note  Brady Stephens is a 48 y.o. male contacted today regarding refills of specialty medication(s) Adalimumab (Humira (2 Pen))   Patient requested Delivery   Delivery date: 11/03/23   Verified address: [address]   [name] [address]   Medication will be filled on 11/02/23.

## 2023-11-10 ENCOUNTER — Encounter: Payer: Self-pay | Admitting: Physician Assistant

## 2023-11-10 ENCOUNTER — Other Ambulatory Visit: Payer: Self-pay

## 2023-11-10 ENCOUNTER — Ambulatory Visit: Payer: Medicaid Other | Attending: Physician Assistant | Admitting: Physician Assistant

## 2023-11-10 VITALS — BP 117/79 | HR 96 | Resp 15 | Ht 68.0 in | Wt 213.8 lb

## 2023-11-10 DIAGNOSIS — M19041 Primary osteoarthritis, right hand: Secondary | ICD-10-CM | POA: Diagnosis not present

## 2023-11-10 DIAGNOSIS — L409 Psoriasis, unspecified: Secondary | ICD-10-CM

## 2023-11-10 DIAGNOSIS — E1165 Type 2 diabetes mellitus with hyperglycemia: Secondary | ICD-10-CM

## 2023-11-10 DIAGNOSIS — E782 Mixed hyperlipidemia: Secondary | ICD-10-CM

## 2023-11-10 DIAGNOSIS — M19042 Primary osteoarthritis, left hand: Secondary | ICD-10-CM

## 2023-11-10 DIAGNOSIS — L4050 Arthropathic psoriasis, unspecified: Secondary | ICD-10-CM

## 2023-11-10 DIAGNOSIS — M5442 Lumbago with sciatica, left side: Secondary | ICD-10-CM

## 2023-11-10 DIAGNOSIS — G8929 Other chronic pain: Secondary | ICD-10-CM

## 2023-11-10 DIAGNOSIS — Z79899 Other long term (current) drug therapy: Secondary | ICD-10-CM

## 2023-11-10 DIAGNOSIS — M5441 Lumbago with sciatica, right side: Secondary | ICD-10-CM

## 2023-11-10 DIAGNOSIS — Z794 Long term (current) use of insulin: Secondary | ICD-10-CM

## 2023-11-10 DIAGNOSIS — M533 Sacrococcygeal disorders, not elsewhere classified: Secondary | ICD-10-CM

## 2023-11-10 DIAGNOSIS — R5383 Other fatigue: Secondary | ICD-10-CM

## 2023-11-10 NOTE — Patient Instructions (Signed)
 Standing Labs We placed an order today for your standing lab work.   Please have your standing labs drawn in early April and every 3 months   Please have your labs drawn 2 weeks prior to your appointment so that the provider can discuss your lab results at your appointment, if possible.  Please note that you may see your imaging and lab results in MyChart before we have reviewed them. We will contact you once all results are reviewed. Please allow our office up to 72 hours to thoroughly review all of the results before contacting the office for clarification of your results.  WALK-IN LAB HOURS  Monday through Thursday from 8:00 am -12:30 pm and 1:00 pm-5:00 pm and Friday from 8:00 am-12:00 pm.  Patients with office visits requiring labs will be seen before walk-in labs.  You may encounter longer than normal wait times. Please allow additional time. Wait times may be shorter on  Monday and Thursday afternoons.  We do not book appointments for walk-in labs. We appreciate your patience and understanding with our staff.   Labs are drawn by [facility]. Please bring your co-pay at the time of your lab draw.  You may receive a bill from [facility] for your lab work.  Please note if you are on Hydroxychloroquine and and an order has been placed for a Hydroxychloroquine level,  you will need to have it drawn 4 hours or more after your last dose.  If you wish to have your labs drawn at another location, please call the office 24 hours in advance so we can fax the orders.  The office is located at [address], [address], [place], [place] [address]   If you have any questions regarding directions or hours of operation,  please call [phone].   As a reminder, please drink plenty of water prior to coming for your lab work. Thanks!

## 2023-11-17 ENCOUNTER — Other Ambulatory Visit: Payer: Self-pay

## 2023-11-19 ENCOUNTER — Other Ambulatory Visit (HOSPITAL_COMMUNITY): Payer: Self-pay

## 2023-11-19 ENCOUNTER — Other Ambulatory Visit: Payer: Self-pay | Admitting: Physician Assistant

## 2023-11-19 DIAGNOSIS — L4050 Arthropathic psoriasis, unspecified: Secondary | ICD-10-CM

## 2023-11-19 DIAGNOSIS — L409 Psoriasis, unspecified: Secondary | ICD-10-CM

## 2023-11-19 DIAGNOSIS — Z79899 Other long term (current) drug therapy: Secondary | ICD-10-CM

## 2023-11-19 MED ORDER — HUMIRA (2 PEN) 40 MG/0.4ML ~~LOC~~ AJKT
40.0000 mg | AUTO-INJECTOR | SUBCUTANEOUS | 2 refills | Status: DC
  Filled 2023-11-19: qty 2, 28d supply, fill #0
  Filled 2023-12-22: qty 2, 28d supply, fill #1
  Filled 2024-01-18: qty 2, 28d supply, fill #2

## 2023-11-19 NOTE — Progress Notes (Signed)
 Specialty Pharmacy Ongoing Clinical Assessment Note  Brady Stephens is a 48 y.o. male who is being followed by the specialty pharmacy service for RxSp Psoriasis   Patient's specialty medication(s) reviewed today: Adalimumab (Humira (2 Pen))   Missed doses in the last 4 weeks: 0   Patient/Caregiver did not have any additional questions or concerns.   Therapeutic benefit summary: Patient is achieving benefit   Adverse events/side effects summary: Experienced adverse events/side effects (fatigue)   Patient's therapy is appropriate to: Continue    Goals Addressed             This Visit's Progress    Minimize recurrence of flares   On track    Patient is initiating therapy. Patient will maintain adherence.  Patient reports he is well-controlled at this time.         Follow up:  6 months  Servando Snare Specialty Pharmacist

## 2023-11-19 NOTE — Telephone Encounter (Signed)
 Last Fill: 08/27/2023  Labs: 09/07/2023 Glucose is elevated-151. Rest of CMP WNL. CBC WNL.   Ok to initiate injectable methotrexate as discussed at appt yesterday.  TB Gold: 04/15/2023 Negative   Next Visit: 02/11/2024  Last Visit: 11/10/2023  [id number] arthropathy (HCC)   Current Dose per office note 11/10/2023: Humira 40 mg sq injections once every 14 days   Okay to refill Humira?

## 2023-11-19 NOTE — Progress Notes (Signed)
 Specialty Pharmacy Refill Coordination Note  Brady Stephens is a 48 y.o. male contacted today regarding refills of specialty medication(s) Adalimumab (Humira (2 Pen))   Patient requested Delivery   Delivery date: 12/02/23   Verified address: [address]   [place] [address]   Medication will be filled on 12/01/23. This fill date is pending response to refill request from provider. Patient is aware and if they have not received fill by intended date they must follow up with pharmacy.

## 2023-12-01 ENCOUNTER — Other Ambulatory Visit: Payer: Self-pay

## 2023-12-01 ENCOUNTER — Other Ambulatory Visit: Payer: Self-pay | Admitting: Family Medicine

## 2023-12-01 DIAGNOSIS — E1165 Type 2 diabetes mellitus with hyperglycemia: Secondary | ICD-10-CM

## 2023-12-01 MED ORDER — TRUEPLUS 5-BEVEL PEN NEEDLES 31G X 8 MM MISC
0 refills | Status: DC
  Filled 2023-12-01: qty 100, 100d supply, fill #0

## 2023-12-03 ENCOUNTER — Other Ambulatory Visit: Payer: Self-pay

## 2023-12-14 ENCOUNTER — Ambulatory Visit: Payer: Medicaid Other | Admitting: Nurse Practitioner

## 2023-12-22 ENCOUNTER — Other Ambulatory Visit: Payer: Self-pay | Admitting: Pharmacy Technician

## 2023-12-22 ENCOUNTER — Other Ambulatory Visit: Payer: Self-pay

## 2023-12-22 NOTE — Progress Notes (Signed)
 Specialty Pharmacy Refill Coordination Note  Brady Stephens is a 48 y.o. male contacted today regarding refills of specialty medication(s) Adalimumab  (Humira  (2 Pen))   Patient requested (Patient-Rptd) Delivery   Delivery date: (Patient-Rptd) 01/01/24   Verified address: (Patient-Rptd) [address] [place], [place] [address]   Medication will be filled on 12/31/23.

## 2023-12-30 ENCOUNTER — Other Ambulatory Visit: Payer: Self-pay

## 2023-12-30 ENCOUNTER — Other Ambulatory Visit (HOSPITAL_COMMUNITY): Payer: Self-pay

## 2023-12-31 ENCOUNTER — Other Ambulatory Visit: Payer: Self-pay

## 2024-01-04 ENCOUNTER — Ambulatory Visit: Admitting: Nurse Practitioner

## 2024-01-12 ENCOUNTER — Other Ambulatory Visit: Payer: Self-pay

## 2024-01-18 ENCOUNTER — Other Ambulatory Visit: Payer: Self-pay

## 2024-01-18 ENCOUNTER — Other Ambulatory Visit: Payer: Self-pay | Admitting: Pharmacy Technician

## 2024-01-18 NOTE — Progress Notes (Signed)
 Specialty Pharmacy Refill Coordination Note  Brady Stephens is a 48 y.o. male contacted today regarding refills of specialty medication(s) Adalimumab  (Humira  (2 Pen))   Patient requested (Patient-Rptd) Delivery   Delivery date: (Patient-Rptd) 02/05/24   Verified address: (Patient-Rptd) [address] [place], [place] [address]   Medication will be filled on 02/04/24.

## 2024-01-20 LAB — HM DIABETES EYE EXAM

## 2024-01-27 ENCOUNTER — Other Ambulatory Visit: Payer: Self-pay

## 2024-01-27 ENCOUNTER — Ambulatory Visit: Attending: Nurse Practitioner | Admitting: Nurse Practitioner

## 2024-01-27 ENCOUNTER — Encounter: Payer: Self-pay | Admitting: Nurse Practitioner

## 2024-01-27 VITALS — BP 107/70 | HR 100 | Resp 19 | Ht 68.0 in | Wt 214.0 lb

## 2024-01-27 DIAGNOSIS — Z794 Long term (current) use of insulin: Secondary | ICD-10-CM | POA: Diagnosis not present

## 2024-01-27 DIAGNOSIS — E119 Type 2 diabetes mellitus without complications: Secondary | ICD-10-CM

## 2024-01-27 DIAGNOSIS — E785 Hyperlipidemia, unspecified: Secondary | ICD-10-CM

## 2024-01-27 LAB — POCT GLYCOSYLATED HEMOGLOBIN (HGB A1C): Hemoglobin A1C: 7.3 % — AB (ref 4.0–5.6)

## 2024-01-27 MED ORDER — DEXCOM G7 SENSOR MISC
6 refills | Status: DC
  Filled 2024-01-27 – 2024-01-29 (×2): qty 2, 30d supply, fill #0

## 2024-01-27 MED ORDER — DEXCOM G7 RECEIVER DEVI
0 refills | Status: DC
  Filled 2024-01-27: qty 1, 30d supply, fill #0

## 2024-01-27 MED ORDER — LANTUS SOLOSTAR 100 UNIT/ML ~~LOC~~ SOPN
25.0000 [IU] | PEN_INJECTOR | Freq: Every day | SUBCUTANEOUS | 3 refills | Status: AC
  Filled 2024-01-27 – 2024-03-29 (×2): qty 15, 60d supply, fill #0
  Filled 2024-05-30: qty 15, 60d supply, fill #1
  Filled 2024-08-01: qty 15, 60d supply, fill #2
  Filled 2024-09-28: qty 15, 60d supply, fill #3

## 2024-01-27 NOTE — Progress Notes (Signed)
 Assessment & Plan:   Brady Stephens was seen today for diabetes.  Diagnoses and all orders for this visit:  Type 2 diabetes mellitus with insulin  therapy  Adding continuous glucose monitor today -     POCT glycosylated hemoglobin (Hb A1C) -     Continuous Glucose Sensor (DEXCOM G7 SENSOR) MISC; Check blood glucose levels continuously E11.65 -     Continuous Glucose Receiver (DEXCOM G7 RECEIVER) DEVI; Check blood glucose levels continuously -     insulin  glargine (LANTUS  SOLOSTAR) 100 UNIT/ML Solostar Pen; Inject 25 Units into the skin daily. -     CMP14+EGFR  Dyslipidemia, goal LDL below 70 -     Lipid panel INSTRUCTIONS: Work on a low fat, heart healthy diet and participate in regular aerobic exercise program by working out at least 150 minutes per week; 5 days a week-30 minutes per day. Avoid red meat/beef/steak,  fried foods. junk foods, sodas, sugary drinks, unhealthy snacking, alcohol and smoking.  Drink at least 80 oz of water per day and monitor your carbohydrate intake daily.     Patient has been counseled on age-appropriate routine health concerns for screening and prevention. These are reviewed and up-to-date. Referrals have been placed accordingly. Immunizations are up-to-date or declined.    Subjective:   Chief Complaint  Patient presents with   Diabetes    Brady Stephens 48 y.o. male presents to office today for follow up to DM  He has a past medical history of diabetes, bilateral carpal tunnel syndrome, psoriatic arthropathy for which he is followed by rheumatology (taking Humira ).    DM 2 A1c has improved and down from 8.2 and currently 7.3.  He is administering 25 units of Lantus  insulin  daily.  Will add Dexcom sensor and receiver today so that he can monitor his diet more closely.  Does not meet criteria currently for Ozempic as he has not been on a trial of metformin.  He declines starting metformin today. Lab Results  Component Value Date   HGBA1C 7.3 (A) 01/27/2024    Lab Results  Component Value Date   HGBA1C 8.2 (A) 09/14/2023     Review of Systems  Constitutional:  Negative for fever, malaise/fatigue and weight loss.  HENT: Negative.  Negative for nosebleeds.   Eyes: Negative.  Negative for blurred vision, double vision and photophobia.  Respiratory: Negative.  Negative for cough and shortness of breath.   Cardiovascular: Negative.  Negative for chest pain, palpitations and leg swelling.  Gastrointestinal: Negative.  Negative for heartburn, nausea and vomiting.  Musculoskeletal: Negative.  Negative for myalgias.  Neurological: Negative.  Negative for dizziness, focal weakness, seizures and headaches.  Psychiatric/Behavioral: Negative.  Negative for suicidal ideas.     Past Medical History:  Diagnosis Date   Carpal tunnel syndrome, bilateral    DM type 2, not at goal [facility])     Past Surgical History:  Procedure Laterality Date   EYE SURGERY     6 months old   HERNIA REPAIR     as child   SPINAL CORD STIMULATOR TRIAL      Family History  Problem Relation Age of Onset   Arthritis Mother    Gout Maternal Grandfather    Healthy Son    Healthy Son    Eczema Son    Healthy Daughter    Eczema Daughter    Healthy Daughter    Healthy Daughter     Social History Reviewed with no changes to be made today.  Outpatient Medications Prior to Visit  Medication Sig Dispense Refill   Accu-Chek Softclix Lancets lancets Check blood glucose level by fingerstick two times per day. 100 each 2   adalimumab  (HUMIRA , 2 PEN,) 40 MG/0.4ML pen Inject 0.4 mLs (40 mg total) into the skin every 14 (fourteen) days. 2 each 2   BLACK CURRANT SEED OIL PO Take by mouth daily.     ELDERBERRY PO Take by mouth.     ezetimibe  (ZETIA ) 10 MG tablet Take 1 tablet (10 mg total) by mouth daily. 90 tablet 3   glucose blood (ACCU-CHEK GUIDE) test strip Check blood glucose level by fingerstick two times per day. 100 each 12   Insulin  Pen Needle (TRUEPLUS 5-BEVEL PEN  NEEDLES) 31G X 8 MM MISC Use as instructed. Inject into the skin once daily 100 each 0   Menthol, Topical Analgesic, (BIOFREEZE EX) Apply topically daily as needed.     methotrexate  50 MG/2ML injection Inject 0.6 mL under the skin weekly for two weeks, if labs are stable then inject 0.8 mL under the skin weekly for two weeks 4 mL 0   Naphazoline-Pheniramine (VISINE-A OP) Place 1 drop into both eyes 2 (two) times daily as needed (dry itchy eyes).     OVER THE COUNTER MEDICATION SEA MOSS     OVER THE COUNTER MEDICATION [employer]     Blood Glucose Monitoring Suppl (ACCU-CHEK GUIDE) w/Device KIT Check blood glucose level by fingerstick two times per day. 1 kit 0   insulin  glargine (LANTUS  SOLOSTAR) 100 UNIT/ML Solostar Pen Inject 25 Units into the skin daily. 15 mL 3   methocarbamol  (ROBAXIN ) 750 MG tablet Take 1 tablet (750 mg total) by mouth 3 (three) times daily. For muscle spasms (Patient taking differently: Take 750 mg by mouth as needed. For muscle spasms) 90 tablet 0   folic acid  (FOLVITE ) 1 MG tablet Take 2 tablets (2 mg total) by mouth daily. (Patient not taking: Reported on 01/27/2024) 180 tablet 3   nystatin  (MYCOSTATIN ) 100000 UNIT/ML suspension Take 5 mLs (500,000 Units total) by mouth 4 (four) times daily. (Patient not taking: Reported on 01/27/2024) 60 mL 1   No facility-administered medications prior to visit.    No Known Allergies     Objective:    BP 107/70 (BP Location: Left Arm, Patient Position: Sitting, Cuff Size: Normal)   Pulse 100   Resp 19   Ht 5\' 8"  (1.727 m)   Wt 214 lb (97.1 kg)   SpO2 100%   BMI 32.54 kg/m  Wt Readings from Last 3 Encounters:  01/27/24 214 lb (97.1 kg)  11/10/23 213 lb 12.8 oz (97 kg)  09/14/23 214 lb 9.6 oz (97.3 kg)    Physical Exam Vitals and nursing note reviewed.  Constitutional:      Appearance: He is well-developed.  HENT:     Head: Normocephalic and atraumatic.  Cardiovascular:     Rate and Rhythm: Regular rhythm. Tachycardia  present.     Heart sounds: Normal heart sounds. No murmur heard.    No friction rub. No gallop.  Pulmonary:     Effort: Pulmonary effort is normal. No tachypnea or respiratory distress.     Breath sounds: Normal breath sounds. No decreased breath sounds, wheezing, rhonchi or rales.  Chest:     Chest wall: No tenderness.  Abdominal:     General: Bowel sounds are normal.     Palpations: Abdomen is soft.  Musculoskeletal:        General: Normal range  of motion.     Cervical back: Normal range of motion.  Skin:    General: Skin is warm and dry.  Neurological:     Mental Status: He is alert and oriented to person, place, and time.     Coordination: Coordination normal.  Psychiatric:        Behavior: Behavior normal. Behavior is cooperative.        Thought Content: Thought content normal.        Judgment: Judgment normal.          Patient has been counseled extensively about nutrition and exercise as well as the importance of adherence with medications and regular follow-up. The patient was given clear instructions to go to ER or return to medical center if symptoms don't improve, worsen or new problems develop. The patient verbalized understanding.   Follow-up: Return in about 3 months (around 04/28/2024).   Collins Dean, FNP-BC [facility] [place], [place] [phone]   01/27/2024, 1:00 PM

## 2024-01-28 ENCOUNTER — Other Ambulatory Visit: Payer: Self-pay

## 2024-01-28 LAB — CMP14+EGFR
ALT: 26 IU/L (ref 0–44)
AST: 19 IU/L (ref 0–40)
Albumin: 4.5 g/dL (ref 4.1–5.1)
Alkaline Phosphatase: 91 IU/L (ref 44–121)
BUN/Creatinine Ratio: 10 (ref 9–20)
BUN: 10 mg/dL (ref 6–24)
Bilirubin Total: 0.4 mg/dL (ref 0.0–1.2)
CO2: 23 mmol/L (ref 20–29)
Calcium: 9.5 mg/dL (ref 8.7–10.2)
Chloride: 100 mmol/L (ref 96–106)
Creatinine, Ser: 1.03 mg/dL (ref 0.76–1.27)
Globulin, Total: 3.1 g/dL (ref 1.5–4.5)
Glucose: 124 mg/dL — ABNORMAL HIGH (ref 70–99)
Potassium: 4.4 mmol/L (ref 3.5–5.2)
Sodium: 140 mmol/L (ref 134–144)
Total Protein: 7.6 g/dL (ref 6.0–8.5)
eGFR: 88 mL/min/{1.73_m2} (ref >59)

## 2024-01-28 LAB — LIPID PANEL
Chol/HDL Ratio: 7.4 ratio — ABNORMAL HIGH (ref 0.0–5.0)
Cholesterol, Total: 280 mg/dL — ABNORMAL HIGH (ref 100–199)
HDL: 38 mg/dL — ABNORMAL LOW (ref >39)
LDL Chol Calc (NIH): 203 mg/dL — ABNORMAL HIGH (ref 0–99)
Triglycerides: 200 mg/dL — ABNORMAL HIGH (ref 0–149)
VLDL Cholesterol Cal: 39 mg/dL (ref 5–40)

## 2024-01-28 NOTE — Progress Notes (Unsigned)
 Office Visit Note  Patient: Brady Stephens             Date of Birth: [birth year]           MRN: [id number]             PCP: Collins Dean, NP Referring: Collins Dean, NP Visit Date: 02/11/2024 Occupation: @GUAROCC @  Subjective:  Medication monitoring   History of Present Illness: Brady Stephens is a 48 y.o. male with history of psoriatic arthritis and osteoarthritis.  Patient remains on  Humira  40 mg sq injections once every 14 days--started 05/26/23.   CBC updated on 09/07/23 CMP updated on 01/27/24.  Discussed the importance of holding humira  if he develops signs or symptoms of an infection and to resume once the infection has completely cleared.   Activities of Daily Living:  Patient reports morning stiffness for *** {minute/hour:19697}.   Patient {ACTIONS;DENIES/REPORTS:21021675::"Denies"} nocturnal pain.  Difficulty dressing/grooming: {ACTIONS;DENIES/REPORTS:21021675::"Denies"} Difficulty climbing stairs: {ACTIONS;DENIES/REPORTS:21021675::"Denies"} Difficulty getting out of chair: {ACTIONS;DENIES/REPORTS:21021675::"Denies"} Difficulty using hands for taps, buttons, cutlery, and/or writing: {ACTIONS;DENIES/REPORTS:21021675::"Denies"}  No Rheumatology ROS completed.   PMFS History:  Patient Active Problem List   Diagnosis Date Noted   Uncontrolled type 2 diabetes mellitus with hyperglycemia, with long-term current use of insulin  (HCC) 12/16/2021    Past Medical History:  Diagnosis Date   Carpal tunnel syndrome, bilateral    DM type 2, not at goal [facility])     Family History  Problem Relation Age of Onset   Arthritis Mother    Gout Maternal Grandfather    Healthy Son    Healthy Son    Eczema Son    Healthy Daughter    Eczema Daughter    Healthy Daughter    Healthy Daughter    Past Surgical History:  Procedure Laterality Date   EYE SURGERY     6 months old   HERNIA REPAIR     as child   SPINAL CORD STIMULATOR TRIAL     Social History   Social History  Narrative   Not on file   There is no immunization history for the selected administration types on file for this patient.   Objective: Vital Signs: There were no vitals taken for this visit.   Physical Exam Vitals and nursing note reviewed.  Constitutional:      Appearance: He is well-developed.  HENT:     Head: Normocephalic and atraumatic.  Eyes:     Conjunctiva/sclera: Conjunctivae normal.     Pupils: Pupils are equal, round, and reactive to light.  Cardiovascular:     Rate and Rhythm: Normal rate and regular rhythm.     Heart sounds: Normal heart sounds.  Pulmonary:     Effort: Pulmonary effort is normal.     Breath sounds: Normal breath sounds.  Abdominal:     General: Bowel sounds are normal.     Palpations: Abdomen is soft.  Musculoskeletal:     Cervical back: Normal range of motion and neck supple.  Skin:    General: Skin is warm and dry.     Capillary Refill: Capillary refill takes less than 2 seconds.  Neurological:     Mental Status: He is alert and oriented to person, place, and time.  Psychiatric:        Behavior: Behavior normal.      Musculoskeletal Exam: ***  CDAI Exam: CDAI Score: -- Patient Global: --; Provider Global: -- Swollen: --; Tender: -- Joint Exam 02/11/2024   No joint  exam has been documented for this visit   There is currently no information documented on the homunculus. Go to the Rheumatology activity and complete the homunculus joint exam.  Investigation: No additional findings.  Imaging: No results found.  Recent Labs: Lab Results  Component Value Date   WBC 8.0 09/07/2023   HGB 16.9 09/07/2023   PLT 271 09/07/2023   NA 140 01/27/2024   K 4.4 01/27/2024   CL 100 01/27/2024   CO2 23 01/27/2024   GLUCOSE 124 (H) 01/27/2024   BUN 10 01/27/2024   CREATININE 1.03 01/27/2024   BILITOT 0.4 01/27/2024   ALKPHOS 91 01/27/2024   AST 19 01/27/2024   ALT 26 01/27/2024   PROT 7.6 01/27/2024   ALBUMIN 4.5 01/27/2024    CALCIUM  9.5 01/27/2024   QFTBGOLDPLUS NEGATIVE 04/15/2023    Speciality Comments: No specialty comments available.  Procedures:  No procedures performed Allergies: Patient has no known allergies.   Assessment / Plan:     Visit Diagnoses: Psoriatic arthropathy (HCC)  Psoriasis  High risk medication use  Primary osteoarthritis of both hands  Chronic SI joint pain  Chronic midline low back pain with bilateral sciatica  Other fatigue  Uncontrolled type 2 diabetes mellitus with hyperglycemia, with long-term current use of insulin  (HCC)  Mixed hyperlipidemia  Orders: No orders of the defined types were placed in this encounter.  No orders of the defined types were placed in this encounter.   Face-to-face time spent with patient was *** minutes. Greater than 50% of time was spent in counseling and coordination of care.  Follow-Up Instructions: No follow-ups on file.   Romayne Clubs, PA-C  Note - This record has been created using Dragon software.  Chart creation errors have been sought, but may not always  have been located. Such creation errors do not reflect on  the standard of medical care.

## 2024-01-29 ENCOUNTER — Other Ambulatory Visit: Payer: Self-pay

## 2024-01-29 ENCOUNTER — Other Ambulatory Visit: Payer: Self-pay | Admitting: Pharmacist

## 2024-01-29 ENCOUNTER — Telehealth: Payer: Self-pay

## 2024-01-29 DIAGNOSIS — E119 Type 2 diabetes mellitus without complications: Secondary | ICD-10-CM

## 2024-01-29 MED ORDER — DEXCOM G7 SENSOR MISC
6 refills | Status: AC
  Filled 2024-01-29: qty 3, 30d supply, fill #0
  Filled 2024-03-29: qty 3, 30d supply, fill #1
  Filled 2024-05-30: qty 3, 30d supply, fill #2
  Filled 2024-06-29: qty 3, 30d supply, fill #0
  Filled 2024-06-29: qty 3, 30d supply, fill #3
  Filled 2024-08-01: qty 3, 30d supply, fill #1
  Filled 2024-08-01: qty 3, 30d supply, fill #0

## 2024-01-29 MED ORDER — CELECOXIB 100 MG PO CAPS
100.0000 mg | ORAL_CAPSULE | Freq: Two times a day (BID) | ORAL | 2 refills | Status: AC
  Filled 2024-01-29: qty 60, 30d supply, fill #0
  Filled 2024-03-29: qty 60, 30d supply, fill #1
  Filled 2024-06-29: qty 60, 30d supply, fill #2
  Filled 2024-06-29: qty 60, 30d supply, fill #0

## 2024-01-29 NOTE — Telephone Encounter (Signed)
 Pharmacy Patient Advocate Encounter   Received notification from CoverMyMeds that prior authorization for [facility] G7 RECEIVER AND SENSORS is required/requested.   Insurance verification completed.   The patient is insured through [facility] [place] .   Per test claim: PA required; PA submitted to above mentioned insurance via CoverMyMeds Key/confirmation #/EOC [id number] Status is pending

## 2024-01-31 ENCOUNTER — Ambulatory Visit: Payer: Self-pay | Admitting: Nurse Practitioner

## 2024-01-31 DIAGNOSIS — E7800 Pure hypercholesterolemia, unspecified: Secondary | ICD-10-CM

## 2024-01-31 MED ORDER — OMEGA-3-ACID ETHYL ESTERS 1 G PO CAPS
1.0000 g | ORAL_CAPSULE | Freq: Two times a day (BID) | ORAL | 1 refills | Status: AC
  Filled 2024-01-31 – 2024-06-29 (×3): qty 180, 90d supply, fill #0

## 2024-02-01 ENCOUNTER — Other Ambulatory Visit: Payer: Self-pay

## 2024-02-04 ENCOUNTER — Other Ambulatory Visit: Payer: Self-pay

## 2024-02-10 ENCOUNTER — Other Ambulatory Visit: Payer: Self-pay

## 2024-02-11 ENCOUNTER — Encounter: Payer: Self-pay | Admitting: Physician Assistant

## 2024-02-11 ENCOUNTER — Ambulatory Visit: Attending: Physician Assistant | Admitting: Physician Assistant

## 2024-02-11 VITALS — BP 112/72 | HR 79 | Resp 14 | Ht 68.0 in | Wt 216.0 lb

## 2024-02-11 DIAGNOSIS — M5441 Lumbago with sciatica, right side: Secondary | ICD-10-CM | POA: Diagnosis present

## 2024-02-11 DIAGNOSIS — L409 Psoriasis, unspecified: Secondary | ICD-10-CM | POA: Insufficient documentation

## 2024-02-11 DIAGNOSIS — E1165 Type 2 diabetes mellitus with hyperglycemia: Secondary | ICD-10-CM | POA: Diagnosis present

## 2024-02-11 DIAGNOSIS — M19041 Primary osteoarthritis, right hand: Secondary | ICD-10-CM | POA: Insufficient documentation

## 2024-02-11 DIAGNOSIS — Z794 Long term (current) use of insulin: Secondary | ICD-10-CM | POA: Insufficient documentation

## 2024-02-11 DIAGNOSIS — M5442 Lumbago with sciatica, left side: Secondary | ICD-10-CM | POA: Diagnosis present

## 2024-02-11 DIAGNOSIS — E782 Mixed hyperlipidemia: Secondary | ICD-10-CM | POA: Diagnosis present

## 2024-02-11 DIAGNOSIS — M19042 Primary osteoarthritis, left hand: Secondary | ICD-10-CM | POA: Insufficient documentation

## 2024-02-11 DIAGNOSIS — R5383 Other fatigue: Secondary | ICD-10-CM | POA: Insufficient documentation

## 2024-02-11 DIAGNOSIS — Z111 Encounter for screening for respiratory tuberculosis: Secondary | ICD-10-CM | POA: Diagnosis present

## 2024-02-11 DIAGNOSIS — Z79899 Other long term (current) drug therapy: Secondary | ICD-10-CM | POA: Diagnosis present

## 2024-02-11 DIAGNOSIS — L4050 Arthropathic psoriasis, unspecified: Secondary | ICD-10-CM | POA: Insufficient documentation

## 2024-02-11 DIAGNOSIS — G8929 Other chronic pain: Secondary | ICD-10-CM | POA: Insufficient documentation

## 2024-02-11 DIAGNOSIS — M533 Sacrococcygeal disorders, not elsewhere classified: Secondary | ICD-10-CM | POA: Diagnosis present

## 2024-02-11 LAB — CBC WITH DIFFERENTIAL/PLATELET
Absolute Lymphocytes: 3219 {cells}/uL (ref 850–3900)
Absolute Monocytes: 585 {cells}/uL (ref 200–950)
Basophils Absolute: 59 {cells}/uL (ref 0–200)
Basophils Relative: 0.8 %
Eosinophils Absolute: 111 {cells}/uL (ref 15–500)
Eosinophils Relative: 1.5 %
HCT: 48.4 % (ref 38.5–50.0)
Hemoglobin: 16.3 g/dL (ref 13.2–17.1)
MCH: 31.2 pg (ref 27.0–33.0)
MCHC: 33.7 g/dL (ref 32.0–36.0)
MCV: 92.7 fL (ref 80.0–100.0)
MPV: 9.5 fL (ref 7.5–12.5)
Monocytes Relative: 7.9 %
Neutro Abs: 3426 {cells}/uL (ref 1500–7800)
Neutrophils Relative %: 46.3 %
Platelets: 260 10*3/uL (ref 140–400)
RBC: 5.22 10*6/uL (ref 4.20–5.80)
RDW: 13.5 % (ref 11.0–15.0)
Total Lymphocyte: 43.5 %
WBC: 7.4 10*3/uL (ref 3.8–10.8)

## 2024-02-11 NOTE — Patient Instructions (Signed)

## 2024-02-12 ENCOUNTER — Ambulatory Visit: Payer: Self-pay | Admitting: Physician Assistant

## 2024-02-12 NOTE — Progress Notes (Signed)
CBC with diff WNL

## 2024-02-25 ENCOUNTER — Other Ambulatory Visit: Payer: Self-pay | Admitting: Physician Assistant

## 2024-02-25 DIAGNOSIS — L409 Psoriasis, unspecified: Secondary | ICD-10-CM

## 2024-02-25 DIAGNOSIS — L4050 Arthropathic psoriasis, unspecified: Secondary | ICD-10-CM

## 2024-02-25 DIAGNOSIS — Z79899 Other long term (current) drug therapy: Secondary | ICD-10-CM

## 2024-02-25 NOTE — Telephone Encounter (Signed)
 Last Fill: 11/19/2023  Labs: 02/11/2024 CBC with diff WNL, 01/27/2024 Glucose 124  TB Gold: 04/14/2024 Neg    Next Visit: 05/13/2024  Last Visit: 02/11/2024  IK:Ednmpjupr arthropathy   Current Dose per office note 02/11/2024: Humira  40 mg sq injections once every 14 days   Okay to refill Humira ?

## 2024-02-28 MED ORDER — HUMIRA (2 PEN) 40 MG/0.4ML ~~LOC~~ AJKT
40.0000 mg | AUTO-INJECTOR | SUBCUTANEOUS | 2 refills | Status: DC
  Filled 2024-02-29 (×2): qty 2, 28d supply, fill #0
  Filled 2024-03-31: qty 2, 28d supply, fill #1
  Filled 2024-05-03: qty 2, 28d supply, fill #2

## 2024-02-29 ENCOUNTER — Other Ambulatory Visit: Payer: Self-pay

## 2024-02-29 NOTE — Progress Notes (Signed)
 Specialty Pharmacy Refill Coordination Note  Brady Stephens is a 48 y.o. male contacted today regarding refills of specialty medication(s) Adalimumab  (Humira  (2 Pen))   Patient requested Delivery   Delivery date: 03/09/24   Verified address: [address] [place], [place] [address]   Medication will be filled on 07.07.25.

## 2024-03-17 ENCOUNTER — Ambulatory Visit: Payer: Medicaid Other | Admitting: Dermatology

## 2024-03-29 ENCOUNTER — Other Ambulatory Visit: Payer: Self-pay

## 2024-03-29 ENCOUNTER — Other Ambulatory Visit: Payer: Self-pay | Admitting: Family Medicine

## 2024-03-29 DIAGNOSIS — E1165 Type 2 diabetes mellitus with hyperglycemia: Secondary | ICD-10-CM

## 2024-03-30 ENCOUNTER — Other Ambulatory Visit: Payer: Self-pay

## 2024-03-30 MED ORDER — TECHLITE PEN NEEDLES 31G X 8 MM MISC
0 refills | Status: DC
  Filled 2024-03-30: qty 100, 100d supply, fill #0

## 2024-03-31 ENCOUNTER — Other Ambulatory Visit: Payer: Self-pay

## 2024-03-31 NOTE — Progress Notes (Signed)
 Specialty Pharmacy Refill Coordination Note  CEBERT DETTMANN is a 48 y.o. male contacted today regarding refills of specialty medication(s) Adalimumab  (Humira  (2 Pen))   Patient requested Delivery   Delivery date: 04/06/24   Verified address: [address] [place],  [address]   Medication will be filled on 04/05/24.

## 2024-04-05 ENCOUNTER — Other Ambulatory Visit: Payer: Self-pay

## 2024-04-13 ENCOUNTER — Ambulatory Visit: Admitting: Dermatology

## 2024-04-26 ENCOUNTER — Other Ambulatory Visit (HOSPITAL_COMMUNITY): Payer: Self-pay

## 2024-04-29 ENCOUNTER — Other Ambulatory Visit: Payer: Self-pay

## 2024-04-29 ENCOUNTER — Ambulatory Visit: Admitting: Nurse Practitioner

## 2024-05-03 ENCOUNTER — Ambulatory Visit: Admitting: Nurse Practitioner

## 2024-05-03 ENCOUNTER — Other Ambulatory Visit: Payer: Self-pay

## 2024-05-03 NOTE — Progress Notes (Signed)
 Specialty Pharmacy Ongoing Clinical Assessment Note  RUVIM RISKO is a 48 y.o. male who is being followed by the specialty pharmacy service for RxSp Psoriasis   Patient's specialty medication(s) reviewed today: Adalimumab  (Humira  (2 Pen))   Missed doses in the last 4 weeks: 0   Patient/Caregiver did not have any additional questions or concerns.   Therapeutic benefit summary: Patient is achieving benefit   Adverse events/side effects summary: No adverse events/side effects   Patient's therapy is appropriate to: Continue    Goals Addressed             This Visit's Progress    Improve or maintain quality of life   On track    Patient is initiating therapy. Patient will be monitored by provider to determine if a change in treatment plan is warranted      Maintain optimal adherence to therapy   On track    Patient is initiating therapy. Patient will maintain adherence      Minimize recurrence of flares   On track    Patient is initiating therapy. Patient will maintain adherence.  Patient reports he is well-controlled at this time.         Follow up: 12 months  Delon CHRISTELLA Brow Specialty Pharmacist

## 2024-05-03 NOTE — Progress Notes (Signed)
 Specialty Pharmacy Refill Coordination Note  Brady Stephens is a 48 y.o. male contacted today regarding refills of specialty medication(s) Adalimumab  (Humira  (2 Pen))   Patient requested Delivery   Delivery date: 05/10/24   Verified address: [address] [place], [place] [address]   Medication will be filled on 05/09/24.

## 2024-05-09 ENCOUNTER — Other Ambulatory Visit: Payer: Self-pay

## 2024-05-13 ENCOUNTER — Ambulatory Visit: Admitting: Physician Assistant

## 2024-05-16 ENCOUNTER — Telehealth: Payer: Self-pay

## 2024-05-16 NOTE — Telephone Encounter (Signed)
 Per previous encounter, pt's current PA expires on 05/13/24.  Submitted an URGENT Prior Authorization request to [facility] for HUMIRA  via CoverMyMeds. Will update once we receive a response.  Key: [id number]

## 2024-05-17 NOTE — Telephone Encounter (Signed)
 Received notification from [employer] regarding a prior authorization for HUMIRA . Authorization has been APPROVED from 05/16/2024 to 05/16/2025. Approval letter sent to scan center.  Authorization # [id number]

## 2024-05-25 ENCOUNTER — Ambulatory Visit: Admitting: Nurse Practitioner

## 2024-05-30 ENCOUNTER — Other Ambulatory Visit: Payer: Self-pay

## 2024-05-30 ENCOUNTER — Other Ambulatory Visit: Payer: Self-pay | Admitting: Nurse Practitioner

## 2024-05-30 DIAGNOSIS — E119 Type 2 diabetes mellitus without complications: Secondary | ICD-10-CM

## 2024-05-30 MED ORDER — DEXCOM G7 RECEIVER DEVI
0 refills | Status: AC
  Filled 2024-05-30: qty 1, 30d supply, fill #0
  Filled 2024-06-29: qty 1, 90d supply, fill #0
  Filled 2024-06-29: qty 1, 360d supply, fill #0
  Filled 2024-06-29: qty 1, 30d supply, fill #0
  Filled 2024-08-01: qty 1, fill #0

## 2024-05-31 NOTE — Progress Notes (Unsigned)
 Office Visit Note  Patient: Brady Stephens             Date of Birth: Mar 21, 1976           MRN: [id number]             PCP: Theotis Haze ORN, NP Referring: Theotis Haze ORN, NP Visit Date: 06/14/2024 Occupation: Data Unavailable  Subjective:  Chronic lower back pain   History of Present Illness: Brady Stephens is a 48 y.o. male with history of psoriatic arthritis.  Patient remains on Humira  40 mg sq injections every 14 days.  He is tolerating Humira  without any side effects or injection site reactions.  Patient previously decided against initiating methotrexate .  At his last office visit a prescription for Celebrex  was sent to the pharmacy but he did not take it.  Patient has been trying to remain active walking on a daily basis to help alleviate his joint pain and stiffness.  He has noticed an improvement in the severity of pain involving his hands and feet.  He continues to have chronic lower back and pelvic pain especially at night. He denies any recent or recurrent infections.  Activities of Daily Living:  Patient reports morning stiffness for 30-45 minutes.   Patient Reports nocturnal pain.  Difficulty dressing/grooming: Denies Difficulty climbing stairs: Reports Difficulty getting out of chair: Reports Difficulty using hands for taps, buttons, cutlery, and/or writing: Reports  Review of Systems  Constitutional:  Positive for fatigue.  HENT:  Negative for mouth sores and mouth dryness.   Eyes:  Positive for dryness.  Respiratory:  Negative for shortness of breath.   Cardiovascular:  Negative for chest pain and palpitations.  Gastrointestinal:  Positive for constipation. Negative for blood in stool and diarrhea.  Endocrine: Negative for increased urination.  Genitourinary:  Negative for involuntary urination.  Musculoskeletal:  Positive for joint pain, gait problem, joint pain and morning stiffness. Negative for joint swelling, myalgias, muscle weakness, muscle tenderness and  myalgias.  Skin:  Negative for color change, rash and sensitivity to sunlight.  Allergic/Immunologic: Negative for susceptible to infections.  Neurological:  Positive for dizziness and headaches.  Hematological:  Negative for swollen glands.  Psychiatric/Behavioral:  Positive for sleep disturbance. Negative for depressed mood. The patient is not nervous/anxious.     PMFS History:  Patient Active Problem List   Diagnosis Date Noted   Uncontrolled type 2 diabetes mellitus with hyperglycemia, with long-term current use of insulin  (HCC) 12/16/2021    Past Medical History:  Diagnosis Date   Carpal tunnel syndrome, bilateral    DM type 2, not at goal [facility])     Family History  Problem Relation Age of Onset   Arthritis Mother    Gout Maternal Grandfather    Healthy Son    Healthy Son    Eczema Son    Healthy Daughter    Eczema Daughter    Healthy Daughter    Healthy Daughter    Past Surgical History:  Procedure Laterality Date   EYE SURGERY     6 months old   HERNIA REPAIR     as child   SPINAL CORD STIMULATOR TRIAL     Social History   Tobacco Use   Smoking status: Some Days    Types: Cigars    Passive exposure: Past   Smokeless tobacco: Never  Vaping Use   Vaping status: Never Used  Substance Use Topics   Alcohol use: Not Currently   Drug use: No  Social History   Social History Narrative   Not on file     There is no immunization history for the selected administration types on file for this patient.   Objective: Vital Signs: BP 114/76   Pulse 77   Temp 98.7 F (37.1 C)   Resp 15   Ht 5' 8 (1.727 m)   Wt 218 lb (98.9 kg)   BMI 33.15 kg/m    Physical Exam Vitals and nursing note reviewed.  Constitutional:      Appearance: He is well-developed.  HENT:     Head: Normocephalic and atraumatic.  Eyes:     Conjunctiva/sclera: Conjunctivae normal.     Pupils: Pupils are equal, round, and reactive to light.  Cardiovascular:     Rate and Rhythm:  Normal rate and regular rhythm.     Heart sounds: Normal heart sounds.  Pulmonary:     Effort: Pulmonary effort is normal.     Breath sounds: Normal breath sounds.  Abdominal:     General: Bowel sounds are normal.     Palpations: Abdomen is soft.  Musculoskeletal:     Cervical back: Normal range of motion and neck supple.  Skin:    General: Skin is warm and dry.     Capillary Refill: Capillary refill takes less than 2 seconds.  Neurological:     Mental Status: He is alert and oriented to person, place, and time.  Psychiatric:        Behavior: Behavior normal.      Musculoskeletal Exam: C-spine has good range of motion.  Limited mobility of the lumbar spine with discomfort.  Tenderness of both SI joints.  Shoulder joints have good range of motion with some stiffness bilaterally.  Elbow joints and wrist joints have good range of motion.  PIP and DIP thickening but no active synovitis noted.  No dactylitis noted.  Discomfort with range of motion of both hips.  Knee joints have good range of motion no warmth or effusion.  Ankle joints have good range of motion with no tenderness.  No evidence of Achilles tendinitis.  CDAI Exam: CDAI Score: -- Patient Global: --; Provider Global: -- Swollen: --; Tender: -- Joint Exam 06/14/2024   No joint exam has been documented for this visit   There is currently no information documented on the homunculus. Go to the Rheumatology activity and complete the homunculus joint exam.  Investigation: No additional findings.  Imaging: No results found.  Recent Labs: Lab Results  Component Value Date   WBC 7.4 02/11/2024   HGB 16.3 02/11/2024   PLT 260 02/11/2024   NA 140 01/27/2024   K 4.4 01/27/2024   CL 100 01/27/2024   CO2 23 01/27/2024   GLUCOSE 124 (H) 01/27/2024   BUN 10 01/27/2024   CREATININE 1.03 01/27/2024   BILITOT 0.4 01/27/2024   ALKPHOS 91 01/27/2024   AST 19 01/27/2024   ALT 26 01/27/2024   PROT 7.6 01/27/2024   ALBUMIN 4.5  01/27/2024   CALCIUM  9.5 01/27/2024   QFTBGOLDPLUS NEGATIVE 04/15/2023    Speciality Comments: No specialty comments available.  Procedures:  No procedures performed Allergies: Patient has no known allergies.   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  Nail dystrophy: No synovitis or dactylitis.  Patient has noticed less severe pain in both hands and both feet since initiating Humira .  No evidence of Achilles tendinitis or  plantar fasciitis.  He continues to have chronic lower back pain especially in the right SI joint with positional changes at night.  He has remained on Humira  40 mg subcu days injections once every 14 days.  He has not had any recent gaps in therapy.  No recent or recurrent infections.  He has decided against adding on methotrexate  as combination therapy due to the concern for immunosuppression on combination therapy.  There was discussion of initiating a trial of Celebrex  but he decided against the use of Celebrex  due to the concern for possible side effects. He would like to remain on Humira  as monotherapy for now.  He was advised to notify us  if he develops any new or worsening symptoms. He will follow-up in the office in 5 months or sooner if needed.  Psoriasis: No active psoriasis noted.  Fingernail dystrophy improving.    High risk medication use - -Humira  40 mg sq injections once every 14 days--started 05/26/23.CXR 04/28/23.Patient has decided against initiating methotrexate .  CBC updated on 02/11/24. CMP updated on 01/27/24 TB gold negative on 04/15/23.  No recent or recurrent infections. Discussed the importance of holding humira  if he develops signs or symptoms of an infection and to resume once the infection has completely cleared.   - Plan: CBC with Differential/Platelet, Comprehensive metabolic panel with GFR, QuantiFERON-TB Gold  Plus  Primary osteoarthritis of both hands - XR suggestive of psoriatic arthritis and osteoarthritis overlap.  Severe arthritis and erosive changes noted on x-rays from 04/15/23. He has severe CMC, PIP, DIP thickening consistent with osteoarthritis of both hands.  No synovitis or dactylitis noted.  He has had less severe pain involving both hands since initiating Humira .  Chronic SI joint pain: Chronic pain.  Patient experiences significant pain in his lower back with positional changes at night.  He has been trying to remain active walking on a daily basis to help alleviate the discomfort and stiffness in his lower back. He will remain on Humira  as prescribed.  Chronic midline low back pain with bilateral sciatica - X-rays of the lumbar spine updated on 04/15/2023: Levoscoliosis mild spondylosis and facet joint arthropathy.  Chronic pain.  Inadequate response to spinal cord stimulator and injections in the past.  Apprehensive to proceed with surgical intervention. Declined a prescription for Celebrex  at this time.  Other medical conditions are listed as follows;   Other fatigue: Chronic, stable.   Uncontrolled type 2 diabetes mellitus with hyperglycemia, with long-term current use of insulin  (HCC)  Mixed hyperlipidemia  Screening for tuberculosis - Plan to update TB gold today. Plan: QuantiFERON-TB Gold Plus  History of immunosuppression therapy - Plan to update TB gold today. Plan: QuantiFERON-TB Gold Plus  Orders: Orders Placed This Encounter  Procedures   CBC with Differential/Platelet   Comprehensive metabolic panel with GFR   QuantiFERON-TB Gold Plus   No orders of the defined types were placed in this encounter.    Follow-Up Instructions: Return in about 5 months (around 11/12/2024) for Psoriatic arthritis.   Waddell CHRISTELLA Craze, PA-C  Note - This record has been created using Dragon software.  Chart creation errors have been sought, but may not always  have been located. Such  creation errors do not reflect on  the standard of medical care.

## 2024-06-01 ENCOUNTER — Other Ambulatory Visit: Payer: Self-pay

## 2024-06-01 ENCOUNTER — Other Ambulatory Visit: Payer: Self-pay | Admitting: Physician Assistant

## 2024-06-01 DIAGNOSIS — L409 Psoriasis, unspecified: Secondary | ICD-10-CM

## 2024-06-01 DIAGNOSIS — Z79899 Other long term (current) drug therapy: Secondary | ICD-10-CM

## 2024-06-01 DIAGNOSIS — L4050 Arthropathic psoriasis, unspecified: Secondary | ICD-10-CM

## 2024-06-01 MED ORDER — HUMIRA (2 PEN) 40 MG/0.4ML ~~LOC~~ AJKT
40.0000 mg | AUTO-INJECTOR | SUBCUTANEOUS | 0 refills | Status: DC
  Filled 2024-06-01 – 2024-06-03 (×2): qty 2, 28d supply, fill #0

## 2024-06-01 NOTE — Telephone Encounter (Signed)
 Last Fill: 02/28/2024  Labs: 02/11/2024 CBC with diff WNL, 01/27/2024 Glucose 124  TB Gold: 04/15/2023 Neg    Next Visit: 06/14/2024  Last Visit: 02/11/2024  IK:Ednmpjupr arthropathy   Current Dose per office note 02/11/2024: Humira  40 mg sq injections once every 14 days   Patient to update labs at upcoming appointment on 06/14/2024.   Okay to refill Humira ?

## 2024-06-02 ENCOUNTER — Other Ambulatory Visit: Payer: Self-pay

## 2024-06-03 ENCOUNTER — Other Ambulatory Visit: Payer: Self-pay

## 2024-06-03 ENCOUNTER — Other Ambulatory Visit: Payer: Self-pay | Admitting: Pharmacy Technician

## 2024-06-03 NOTE — Progress Notes (Signed)
 Specialty Pharmacy Refill Coordination Note  Brady Stephens is a 48 y.o. male contacted today regarding refills of specialty medication(s) Adalimumab  (Humira  (2 Pen))   Patient requested Delivery   Delivery date: 06/08/24   Verified address: [address]  [place]   Medication will be filled on 06/07/24.

## 2024-06-06 ENCOUNTER — Other Ambulatory Visit: Payer: Self-pay

## 2024-06-14 ENCOUNTER — Ambulatory Visit: Attending: Physician Assistant | Admitting: Physician Assistant

## 2024-06-14 ENCOUNTER — Encounter: Payer: Self-pay | Admitting: Physician Assistant

## 2024-06-14 VITALS — BP 114/76 | HR 77 | Temp 98.7°F | Resp 15 | Ht 68.0 in | Wt 218.0 lb

## 2024-06-14 DIAGNOSIS — R5383 Other fatigue: Secondary | ICD-10-CM | POA: Diagnosis present

## 2024-06-14 DIAGNOSIS — M5441 Lumbago with sciatica, right side: Secondary | ICD-10-CM | POA: Diagnosis present

## 2024-06-14 DIAGNOSIS — M533 Sacrococcygeal disorders, not elsewhere classified: Secondary | ICD-10-CM | POA: Diagnosis present

## 2024-06-14 DIAGNOSIS — L4050 Arthropathic psoriasis, unspecified: Secondary | ICD-10-CM | POA: Insufficient documentation

## 2024-06-14 DIAGNOSIS — Z794 Long term (current) use of insulin: Secondary | ICD-10-CM | POA: Diagnosis present

## 2024-06-14 DIAGNOSIS — Z79899 Other long term (current) drug therapy: Secondary | ICD-10-CM | POA: Insufficient documentation

## 2024-06-14 DIAGNOSIS — Z111 Encounter for screening for respiratory tuberculosis: Secondary | ICD-10-CM | POA: Insufficient documentation

## 2024-06-14 DIAGNOSIS — Z9225 Personal history of immunosupression therapy: Secondary | ICD-10-CM | POA: Insufficient documentation

## 2024-06-14 DIAGNOSIS — G8929 Other chronic pain: Secondary | ICD-10-CM | POA: Diagnosis present

## 2024-06-14 DIAGNOSIS — M19041 Primary osteoarthritis, right hand: Secondary | ICD-10-CM | POA: Insufficient documentation

## 2024-06-14 DIAGNOSIS — M19042 Primary osteoarthritis, left hand: Secondary | ICD-10-CM | POA: Diagnosis present

## 2024-06-14 DIAGNOSIS — M5442 Lumbago with sciatica, left side: Secondary | ICD-10-CM | POA: Diagnosis present

## 2024-06-14 DIAGNOSIS — L409 Psoriasis, unspecified: Secondary | ICD-10-CM | POA: Diagnosis present

## 2024-06-14 DIAGNOSIS — E782 Mixed hyperlipidemia: Secondary | ICD-10-CM | POA: Diagnosis present

## 2024-06-14 DIAGNOSIS — E1165 Type 2 diabetes mellitus with hyperglycemia: Secondary | ICD-10-CM | POA: Insufficient documentation

## 2024-06-14 NOTE — Patient Instructions (Signed)
 Standing Labs We placed an order today for your standing lab work.   Please have your standing labs drawn in January and every 3 months   Please have your labs drawn 2 weeks prior to your appointment so that the provider can discuss your lab results at your appointment, if possible.  Please note that you may see your imaging and lab results in MyChart before we have reviewed them. We will contact you once all results are reviewed. Please allow our office up to 72 hours to thoroughly review all of the results before contacting the office for clarification of your results.  WALK-IN LAB HOURS  Monday through Thursday from 8:00 am -12:30 pm and 1:00 pm-4:30 pm and Friday from 8:00 am-12:00 pm.  Patients with office visits requiring labs will be seen before walk-in labs.  You may encounter longer than normal wait times. Please allow additional time. Wait times may be shorter on  Monday and Thursday afternoons.  We do not book appointments for walk-in labs. We appreciate your patience and understanding with our staff.   Labs are drawn by [facility]. Please bring your co-pay at the time of your lab draw.  You may receive a bill from [facility] for your lab work.  Please note if you are on Hydroxychloroquine  and and an order has been placed for a Hydroxychloroquine  level,  you will need to have it drawn 4 hours or more after your last dose.  If you wish to have your labs drawn at another location, please call the office 24 hours in advance so we can fax the orders.  The office is located at [address], [address], [place], [place] [address]   If you have any questions regarding directions or hours of operation,  please call [phone].   As a reminder, please drink plenty of water prior to coming for your lab work. Thanks!

## 2024-06-15 ENCOUNTER — Ambulatory Visit: Admitting: Nurse Practitioner

## 2024-06-15 ENCOUNTER — Ambulatory Visit: Payer: Self-pay | Admitting: Physician Assistant

## 2024-06-15 NOTE — Progress Notes (Signed)
 Glucose is 138, rest of CMP WNL.   Hematocrit is borderline elevated, rest of CBC WNL.  No change in treatment.

## 2024-06-16 LAB — CBC WITH DIFFERENTIAL/PLATELET
Absolute Lymphocytes: 3003 {cells}/uL (ref 850–3900)
Absolute Monocytes: 455 {cells}/uL (ref 200–950)
Basophils Absolute: 63 {cells}/uL (ref 0–200)
Basophils Relative: 0.9 %
Eosinophils Absolute: 126 {cells}/uL (ref 15–500)
Eosinophils Relative: 1.8 %
HCT: 50.6 % — ABNORMAL HIGH (ref 38.5–50.0)
Hemoglobin: 17.1 g/dL (ref 13.2–17.1)
MCH: 31.7 pg (ref 27.0–33.0)
MCHC: 33.8 g/dL (ref 32.0–36.0)
MCV: 93.9 fL (ref 80.0–100.0)
MPV: 9.5 fL (ref 7.5–12.5)
Monocytes Relative: 6.5 %
Neutro Abs: 3353 {cells}/uL (ref 1500–7800)
Neutrophils Relative %: 47.9 %
Platelets: 265 Thousand/uL (ref 140–400)
RBC: 5.39 Million/uL (ref 4.20–5.80)
RDW: 13.8 % (ref 11.0–15.0)
Total Lymphocyte: 42.9 %
WBC: 7 Thousand/uL (ref 3.8–10.8)

## 2024-06-16 LAB — COMPREHENSIVE METABOLIC PANEL WITH GFR
AG Ratio: 1.5 (calc) (ref 1.0–2.5)
ALT: 19 U/L (ref 9–46)
AST: 16 U/L (ref 10–35)
Albumin: 4.4 g/dL (ref 3.6–5.1)
Alkaline phosphatase (APISO): 69 U/L (ref 35–144)
BUN: 11 mg/dL (ref 7–25)
CO2: 29 mmol/L (ref 20–32)
Calcium: 9.3 mg/dL (ref 8.6–10.3)
Chloride: 102 mmol/L (ref 98–110)
Creat: 1.13 mg/dL (ref 0.70–1.30)
Globulin: 2.9 g/dL (ref 1.9–3.7)
Glucose, Bld: 138 mg/dL — ABNORMAL HIGH (ref 65–99)
Potassium: 4.5 mmol/L (ref 3.5–5.3)
Sodium: 138 mmol/L (ref 135–146)
Total Bilirubin: 0.5 mg/dL (ref 0.2–1.2)
Total Protein: 7.3 g/dL (ref 6.1–8.1)
eGFR: 79 mL/min/1.73m2 (ref >=60)

## 2024-06-16 LAB — QUANTIFERON-TB GOLD PLUS
Mitogen-NIL: 7.22 [IU]/mL
NIL: 0.06 [IU]/mL
QuantiFERON-TB Gold Plus: NEGATIVE
TB1-NIL: <0 [IU]/mL
TB2-NIL: <0 [IU]/mL

## 2024-06-16 NOTE — Progress Notes (Signed)
 TB gold negative

## 2024-06-28 ENCOUNTER — Other Ambulatory Visit: Payer: Self-pay | Admitting: Physician Assistant

## 2024-06-28 ENCOUNTER — Other Ambulatory Visit: Payer: Self-pay

## 2024-06-28 ENCOUNTER — Other Ambulatory Visit (HOSPITAL_COMMUNITY): Payer: Self-pay

## 2024-06-28 DIAGNOSIS — Z79899 Other long term (current) drug therapy: Secondary | ICD-10-CM

## 2024-06-28 DIAGNOSIS — L4050 Arthropathic psoriasis, unspecified: Secondary | ICD-10-CM

## 2024-06-28 DIAGNOSIS — L409 Psoriasis, unspecified: Secondary | ICD-10-CM

## 2024-06-28 MED ORDER — HUMIRA (2 PEN) 40 MG/0.4ML ~~LOC~~ AJKT
40.0000 mg | AUTO-INJECTOR | SUBCUTANEOUS | 2 refills | Status: DC
  Filled 2024-06-28 – 2024-06-30 (×2): qty 2, 28d supply, fill #0
  Filled 2024-08-01 – 2024-08-02 (×2): qty 2, 28d supply, fill #1
  Filled 2024-08-29: qty 2, 28d supply, fill #2

## 2024-06-28 NOTE — Telephone Encounter (Signed)
 Last Fill: 06/01/2024 (30 day supply)  Labs: 06/14/2024 Glucose is 138, rest of CMP WNL.   Hematocrit is borderline elevated, rest of CBC WNL.  No change in treatment.  TB Gold: 06/14/2024 Neg    Next Visit: 11/15/2024  Last Visit: 06/14/2024  IK:Ednmpjupr arthropathy   Current Dose per office note 06/14/2024: Humira  40 mg sq injections once every 14 days   Okay to refill Humira ?

## 2024-06-29 ENCOUNTER — Other Ambulatory Visit: Payer: Self-pay | Admitting: Family Medicine

## 2024-06-29 ENCOUNTER — Other Ambulatory Visit: Payer: Self-pay

## 2024-06-29 ENCOUNTER — Other Ambulatory Visit (HOSPITAL_COMMUNITY): Payer: Self-pay

## 2024-06-29 DIAGNOSIS — E1165 Type 2 diabetes mellitus with hyperglycemia: Secondary | ICD-10-CM

## 2024-06-29 MED ORDER — TECHLITE PEN NEEDLES 31G X 8 MM MISC
0 refills | Status: DC
  Filled 2024-06-29 (×2): qty 100, 100d supply, fill #0

## 2024-06-30 ENCOUNTER — Other Ambulatory Visit: Payer: Self-pay

## 2024-06-30 ENCOUNTER — Other Ambulatory Visit: Payer: Self-pay | Admitting: Pharmacy Technician

## 2024-06-30 NOTE — Progress Notes (Signed)
 Specialty Pharmacy Refill Coordination Note  Brady Stephens is a 48 y.o. male contacted today regarding refills of specialty medication(s) Adalimumab  (Humira  (2 Pen))   Patient requested Delivery   Delivery date: 07/06/24   Verified address: [address]  [place]   Medication will be filled on: 07/05/24

## 2024-07-04 ENCOUNTER — Other Ambulatory Visit: Payer: Self-pay

## 2024-07-06 ENCOUNTER — Ambulatory Visit: Admitting: Nurse Practitioner

## 2024-07-26 ENCOUNTER — Other Ambulatory Visit (HOSPITAL_COMMUNITY): Payer: Self-pay

## 2024-08-01 ENCOUNTER — Other Ambulatory Visit: Payer: Self-pay

## 2024-08-01 ENCOUNTER — Other Ambulatory Visit: Payer: Self-pay | Admitting: Family Medicine

## 2024-08-01 DIAGNOSIS — E1165 Type 2 diabetes mellitus with hyperglycemia: Secondary | ICD-10-CM

## 2024-08-01 MED ORDER — TECHLITE PEN NEEDLES 31G X 8 MM MISC
0 refills | Status: AC
  Filled 2024-08-01: qty 100, fill #0
  Filled 2024-09-28: qty 100, 90d supply, fill #0

## 2024-08-02 ENCOUNTER — Other Ambulatory Visit: Payer: Self-pay

## 2024-08-04 ENCOUNTER — Other Ambulatory Visit: Payer: Self-pay

## 2024-08-04 ENCOUNTER — Other Ambulatory Visit (HOSPITAL_COMMUNITY): Payer: Self-pay

## 2024-08-04 NOTE — Progress Notes (Signed)
 Specialty Pharmacy Refill Coordination Note  Brady Stephens is a 48 y.o. male contacted today regarding refills of specialty medication(s) Adalimumab  (Humira  (2 Pen))   Patient requested Delivery   Delivery date: 08/09/24   Verified address: [address]  [place]   Medication will be filled on: 08/08/24

## 2024-08-08 ENCOUNTER — Other Ambulatory Visit: Payer: Self-pay

## 2024-08-10 ENCOUNTER — Other Ambulatory Visit: Payer: Self-pay

## 2024-08-17 ENCOUNTER — Ambulatory Visit: Admitting: Nurse Practitioner

## 2024-08-29 ENCOUNTER — Other Ambulatory Visit: Payer: Self-pay

## 2024-08-29 NOTE — Progress Notes (Signed)
 Specialty Pharmacy Refill Coordination Note  Brady Stephens is a 48 y.o. male contacted today regarding refills of specialty medication(s) Adalimumab  (Humira  (2 Pen))   Patient requested (Patient-Rptd) Delivery   Delivery date: 09/06/24   Verified address: (Patient-Rptd) [address] [place] [address]   Medication will be filled on: 09/05/24

## 2024-09-05 ENCOUNTER — Other Ambulatory Visit: Payer: Self-pay

## 2024-09-23 ENCOUNTER — Other Ambulatory Visit: Payer: Self-pay | Admitting: Physician Assistant

## 2024-09-23 ENCOUNTER — Other Ambulatory Visit: Payer: Self-pay

## 2024-09-23 DIAGNOSIS — Z79899 Other long term (current) drug therapy: Secondary | ICD-10-CM

## 2024-09-23 DIAGNOSIS — L409 Psoriasis, unspecified: Secondary | ICD-10-CM

## 2024-09-23 DIAGNOSIS — L4050 Arthropathic psoriasis, unspecified: Secondary | ICD-10-CM

## 2024-09-23 MED ORDER — HUMIRA (2 PEN) 40 MG/0.4ML ~~LOC~~ AJKT
40.0000 mg | AUTO-INJECTOR | SUBCUTANEOUS | 0 refills | Status: AC
  Filled 2024-09-23 – 2024-09-26 (×2): qty 0.8, 28d supply, fill #0

## 2024-09-23 NOTE — Telephone Encounter (Signed)
 Last Fill: 06/28/2024  Labs: 06/14/2024  Glucose is 138, rest of CMP WNL.   Hematocrit is borderline elevated, rest of CBC WNL.  No change in treatment.  TB Gold: 06/14/2024   Next Visit: 11/15/2024  Last Visit: 06/14/2024  IK:Ednmpjupr arthropathy   Current Dose per office note 06/14/2024: Humira  40 mg sq injections once every 14 days   Patient advised he is due to update labs. Patient will try to update next week depending on the weather.   Okay to refill Humira ?

## 2024-09-26 ENCOUNTER — Telehealth: Payer: Self-pay | Admitting: Nurse Practitioner

## 2024-09-26 ENCOUNTER — Other Ambulatory Visit: Payer: Self-pay

## 2024-09-26 NOTE — Telephone Encounter (Signed)
 Contacted pt left vm to resch appt office will be closed if the pt calls back please resch appt

## 2024-09-26 NOTE — Telephone Encounter (Signed)
 Due to inclement weather, our office will be closed tomorrow 09/27/2024 . Well reach out to reschedule your appointment. Thank you.Please call [phone] ( if the pt calls back please resch appt )

## 2024-09-27 ENCOUNTER — Ambulatory Visit: Admitting: Nurse Practitioner

## 2024-09-28 ENCOUNTER — Other Ambulatory Visit: Payer: Self-pay

## 2024-09-28 ENCOUNTER — Telehealth: Payer: Self-pay | Admitting: Nurse Practitioner

## 2024-09-28 NOTE — Telephone Encounter (Signed)
 Pt confirmed appt

## 2024-09-29 ENCOUNTER — Other Ambulatory Visit: Payer: Self-pay

## 2024-09-29 ENCOUNTER — Ambulatory Visit: Admitting: *Deleted

## 2024-09-29 ENCOUNTER — Other Ambulatory Visit: Payer: Self-pay | Admitting: *Deleted

## 2024-09-29 DIAGNOSIS — Z79899 Other long term (current) drug therapy: Secondary | ICD-10-CM

## 2024-09-30 ENCOUNTER — Other Ambulatory Visit: Payer: Self-pay

## 2024-09-30 ENCOUNTER — Ambulatory Visit: Payer: Self-pay | Admitting: Physician Assistant

## 2024-09-30 ENCOUNTER — Other Ambulatory Visit: Payer: Self-pay | Admitting: Pharmacy Technician

## 2024-09-30 DIAGNOSIS — Z79899 Other long term (current) drug therapy: Secondary | ICD-10-CM

## 2024-09-30 LAB — COMPREHENSIVE METABOLIC PANEL WITH GFR
AG Ratio: 1.5 (calc) (ref 1.0–2.5)
ALT: 21 U/L (ref 9–46)
AST: 16 U/L (ref 10–35)
Albumin: 4.4 g/dL (ref 3.6–5.1)
Alkaline phosphatase (APISO): 89 U/L (ref 35–144)
BUN: 15 mg/dL (ref 7–25)
CO2: 31 mmol/L (ref 20–32)
Calcium: 9.5 mg/dL (ref 8.6–10.3)
Chloride: 99 mmol/L (ref 98–110)
Creat: 1.1 mg/dL (ref 0.70–1.30)
Globulin: 2.9 g/dL (ref 1.9–3.7)
Glucose, Bld: 236 mg/dL — ABNORMAL HIGH (ref 65–99)
Potassium: 4.2 mmol/L (ref 3.5–5.3)
Sodium: 137 mmol/L (ref 135–146)
Total Bilirubin: 0.6 mg/dL (ref 0.2–1.2)
Total Protein: 7.3 g/dL (ref 6.1–8.1)
eGFR: 82 mL/min/{1.73_m2}

## 2024-09-30 LAB — CBC WITH DIFFERENTIAL/PLATELET
Absolute Lymphocytes: 3808 {cells}/uL (ref 850–3900)
Absolute Monocytes: 547 {cells}/uL (ref 200–950)
Basophils Absolute: 68 {cells}/uL (ref 0–200)
Basophils Relative: 0.9 %
Eosinophils Absolute: 160 {cells}/uL (ref 15–500)
Eosinophils Relative: 2.1 %
HCT: 52.3 % — ABNORMAL HIGH (ref 39.4–51.1)
Hemoglobin: 17.6 g/dL — ABNORMAL HIGH (ref 13.2–17.1)
MCH: 31 pg (ref 27.0–33.0)
MCHC: 33.7 g/dL (ref 31.6–35.4)
MCV: 92.1 fL (ref 81.4–101.7)
MPV: 10 fL (ref 7.5–12.5)
Monocytes Relative: 7.2 %
Neutro Abs: 3017 {cells}/uL (ref 1500–7800)
Neutrophils Relative %: 39.7 %
Platelets: 278 10*3/uL (ref 140–400)
RBC: 5.68 Million/uL (ref 4.20–5.80)
RDW: 13.1 % (ref 11.0–15.0)
Total Lymphocyte: 50.1 %
WBC: 7.6 10*3/uL (ref 3.8–10.8)

## 2024-09-30 NOTE — Progress Notes (Signed)
 Specialty Pharmacy Refill Coordination Note  Brady Stephens is a 49 y.o. male contacted today regarding refills of specialty medication(s) Adalimumab  (Humira  (2 Pen))   Patient requested Delivery   Delivery date: 10/05/24   Verified address: [address] [place]   Medication will be filled on: 10/04/24

## 2024-10-03 ENCOUNTER — Other Ambulatory Visit: Payer: Self-pay

## 2024-10-04 ENCOUNTER — Other Ambulatory Visit: Payer: Self-pay

## 2024-10-17 ENCOUNTER — Ambulatory Visit: Admitting: Nurse Practitioner

## 2024-11-15 ENCOUNTER — Ambulatory Visit: Admitting: Physician Assistant

## 2024-11-17 ENCOUNTER — Ambulatory Visit: Admitting: Dermatology
